# Patient Record
Sex: Female | Born: 1975 | Race: Black or African American | Hispanic: No | Marital: Married | Smoking: Never smoker
Health system: Southern US, Community
[De-identification: ages and names within clinical notes are randomized; demographics above are authoritative.]

## PROBLEM LIST (undated history)

## (undated) DIAGNOSIS — I10 Essential (primary) hypertension: Secondary | ICD-10-CM

---

## 2014-05-13 ENCOUNTER — Ambulatory Visit: Payer: Self-pay

## 2015-02-19 ENCOUNTER — Encounter (HOSPITAL_COMMUNITY): Payer: Self-pay | Admitting: Emergency Medicine

## 2015-02-19 ENCOUNTER — Emergency Department (HOSPITAL_COMMUNITY)
Admission: EM | Admit: 2015-02-19 | Discharge: 2015-02-19 | Disposition: A | Discharge status: Home / Self Care | Payer: Self-pay | Attending: Emergency Medicine | Admitting: Emergency Medicine

## 2015-02-19 DIAGNOSIS — X58XXXA Exposure to other specified factors, initial encounter: Secondary | ICD-10-CM | POA: Insufficient documentation

## 2015-02-19 DIAGNOSIS — Y929 Unspecified place or not applicable: Secondary | ICD-10-CM | POA: Insufficient documentation

## 2015-02-19 DIAGNOSIS — Y939 Activity, unspecified: Secondary | ICD-10-CM | POA: Insufficient documentation

## 2015-02-19 DIAGNOSIS — T63441A Toxic effect of venom of bees, accidental (unintentional), initial encounter: Secondary | ICD-10-CM | POA: Insufficient documentation

## 2015-02-19 DIAGNOSIS — Y999 Unspecified external cause status: Secondary | ICD-10-CM | POA: Insufficient documentation

## 2015-02-19 MED ORDER — FAMOTIDINE IN NACL 20-0.9 MG/50ML-% IV SOLN
20.0000 mg | Freq: Once | INTRAVENOUS | Status: AC
  Administered 2015-02-19: 20 mg via INTRAVENOUS
  Filled 2015-02-19: qty 50

## 2015-02-19 MED ORDER — SODIUM CHLORIDE 0.9 % IV BOLUS (SEPSIS)
1000.0000 mL | Freq: Once | INTRAVENOUS | Status: AC
  Administered 2015-02-19: 1000 mL via INTRAVENOUS

## 2015-02-19 MED ORDER — DEXAMETHASONE SODIUM PHOSPHATE 10 MG/ML IJ SOLN
10.0000 mg | Freq: Once | INTRAMUSCULAR | Status: AC
  Administered 2015-02-19: 10 mg via INTRAVENOUS
  Filled 2015-02-19: qty 1

## 2015-02-19 NOTE — ED Provider Notes (Signed)
CSN: [id number]     Arrival date & time 02/19/15  1925 History   First MD Initiated Contact with Patient 02/19/15 1942     Chief Complaint  Patient presents with  . Insect Bite     (Consider location/radiation/quality/duration/timing/severity/associated sxs/prior Treatment) HPI Patient presents to the emergency department with a bee sting to her right axillary region.  The patient states that she was stung by a bee this afternoon in the area swollen and red.  The patient states that there is some pain noted to the area.  Patient denies shortness of breath, chest pain, nausea, vomiting, weakness, dizziness, headache, blurred vision, or syncope.  The patient states that she does not have any chest tightness or wheezing.  The patient denies that anything makes her condition better or worse.  She did not take any medications prior to arrival  History reviewed. No pertinent past medical history. History reviewed. No pertinent past surgical history. No family history on file. History  Substance Use Topics  . Smoking status: Never Smoker   . Smokeless tobacco: Not on file  . Alcohol Use: No   OB History    No data available     Review of Systems  All other systems negative except as documented in the HPI. All pertinent positives and negatives as reviewed in the HPI.  Allergies  Review of patient's allergies indicates no known allergies.  Home Medications   Prior to Admission medications   Not on File   BP 124/81 mmHg  Pulse 68  Temp(Src) 99.6 F (37.6 C) (Oral)  Resp 16  Ht 5\' 8"  (1.727 m)  Wt 187 lb (84.823 kg)  BMI 28.44 kg/m2  SpO2 99%  LMP 01/27/2015 Physical Exam  Constitutional: She is oriented to person, place, and time. She appears well-developed and well-nourished. No distress.  HENT:  Head: Normocephalic and atraumatic.  Mouth/Throat: Oropharynx is clear and moist. No oropharyngeal exudate.  Eyes: Pupils are equal, round, and reactive to light.  Neck: Normal  range of motion. Neck supple.  Cardiovascular: Normal rate, regular rhythm and normal heart sounds.  Exam reveals no gallop and no friction rub.   No murmur heard. Pulmonary/Chest: Effort normal and breath sounds normal. No respiratory distress.  Neurological: She is alert and oriented to person, place, and time.  Skin: Skin is warm and dry. No rash noted. No erythema.     Nursing note and vitals reviewed.   ED Course  Procedures (including critical care time)   Patient was given IV fluids along with Decadron and Pepcid.  She will be told to return here as needed.  Advised the patient take Benadryl use ice on the area  Charlestine Night, PA-C 02/19/15 2208  Bethann Berkshire, MD 02/21/15 1530

## 2015-02-19 NOTE — Discharge Instructions (Signed)
Take Benadryl and over-the-counter Pepcid.  Use ice on the areas well.  Return here as needed

## 2015-02-19 NOTE — ED Notes (Signed)
Pt. Stung by a bee this afternoon at right axilla , presents with redness/swelling at right axilla , airway intact/ respirations unlabored .

## 2015-02-19 NOTE — ED Notes (Signed)
Pt was stung by a bee to right axilla. 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 Reports she just wanted to be safe in case she had a reaction.

## 2016-03-21 ENCOUNTER — Emergency Department (HOSPITAL_COMMUNITY): Payer: Self-pay

## 2016-03-21 ENCOUNTER — Encounter (HOSPITAL_COMMUNITY): Payer: Self-pay | Admitting: Emergency Medicine

## 2016-03-21 ENCOUNTER — Emergency Department (HOSPITAL_COMMUNITY)
Admission: EM | Admit: 2016-03-21 | Discharge: 2016-03-21 | Disposition: A | Discharge status: Home / Self Care | Payer: Self-pay | Attending: Emergency Medicine | Admitting: Emergency Medicine

## 2016-03-21 DIAGNOSIS — Y9367 Activity, basketball: Secondary | ICD-10-CM | POA: Insufficient documentation

## 2016-03-21 DIAGNOSIS — Y999 Unspecified external cause status: Secondary | ICD-10-CM | POA: Insufficient documentation

## 2016-03-21 DIAGNOSIS — S93402A Sprain of unspecified ligament of left ankle, initial encounter: Secondary | ICD-10-CM | POA: Insufficient documentation

## 2016-03-21 DIAGNOSIS — X509XXA Other and unspecified overexertion or strenuous movements or postures, initial encounter: Secondary | ICD-10-CM | POA: Insufficient documentation

## 2016-03-21 DIAGNOSIS — Y92310 Basketball court as the place of occurrence of the external cause: Secondary | ICD-10-CM | POA: Insufficient documentation

## 2016-03-21 MED ORDER — OXYCODONE-ACETAMINOPHEN 5-325 MG PO TABS
1.0000 | ORAL_TABLET | Freq: Once | ORAL | Status: AC
  Administered 2016-03-21: 1 via ORAL
  Filled 2016-03-21: qty 1

## 2016-03-21 MED ORDER — CYCLOBENZAPRINE HCL 10 MG PO TABS: 10.0000 mg | ORAL_TABLET | Freq: Two times a day (BID) | ORAL | 0 refills | Status: DC | PRN

## 2016-03-21 MED ORDER — IBUPROFEN 800 MG PO TABS: 800.0000 mg | ORAL_TABLET | Freq: Three times a day (TID) | ORAL | 0 refills | Status: DC

## 2016-03-21 NOTE — Progress Notes (Signed)
Orthopedic Tech Progress Note Patient Details:  Jessica Valdez 08-21-1976 [id number]  Ortho Devices Type of Ortho Device: ASO, Crutches Ortho Device/Splint Location: LLE Ortho Device/Splint Interventions: Ordered, Application, Adjustment   Jennye Moccasin 03/21/2016, 10:23 PM

## 2016-03-21 NOTE — ED Provider Notes (Signed)
[facility]EM[facility] DEPT Provider Note   CSN: [id number] Arrival date & time: 03/21/16  2052  First Provider Contact:  None    By signing my name below, I, [facility], attest that this documentation has been prepared under the direction and in the presence of Fayrene Helper, PA-C. Electronically Signed: Randell Patient, ED Scribe. 03/21/16. 10:11 PM.    History   Chief Complaint Chief Complaint  Patient presents with  . Ankle Pain    HPI Jessica Valdez is a 40 y.o. female who presents to the Emergency Department complaining of constant, severe, left ankle pain onset after a sports injury that occurred 2 hours ago. Pt states that she was playing basketball when she jumped up, landed, twisted her left ankle followed immediately by pain after which she fell to the ground. She reports associated swelling to the left ankle. Pain is worse with weight bearing and palpation. She has taken 3x ibuprofen 2 hours ago without relief. Denies any other injuries or numbness.  The history is provided by the patient. No language interpreter was used.    History reviewed. No pertinent past medical history.  There are no active problems to display for this patient.   History reviewed. No pertinent surgical history.  OB History    No data available       Home Medications    Prior to Admission medications   Not on File    Family History No family history on file.  Social History Social History  Substance Use Topics  . Smoking status: Never Smoker  . Smokeless tobacco: Not on file  . Alcohol use No     Allergies   Review of patient's allergies indicates no known allergies.   Review of Systems Review of Systems  Musculoskeletal: Positive for arthralgias (left ankle) and joint swelling.  Neurological: Negative for numbness.     Physical Exam Updated Vital Signs BP (!) 155/103 (BP Location: Left Arm)   Pulse 77   Temp 98.2 F (36.8 C) (Oral)   Resp 18   Ht   (1.727 m)   Wt 175 lb (79.4 kg)   LMP 03/08/2016   SpO2 100%   BMI 26.61 kg/m   Physical Exam  Constitutional: She is oriented to person, place, and time. She appears well-developed and well-nourished. No distress.  HENT:  Head: Normocephalic and atraumatic.  Eyes: Conjunctivae are normal.  Neck: Normal range of motion.  Cardiovascular: Normal rate.   Pulmonary/Chest: Effort normal. No respiratory distress.  Musculoskeletal: Normal range of motion. She exhibits edema and tenderness.  Significant tenderness to left lateral malleolus without skin tenting. Tenderness to medial malleolus and fifth metatarsal. Decreased ROM secondary to pain. Mild tenderness to left tib-fib region without crepitus. Left knee is normal.  Neurological: She is alert and oriented to person, place, and time.  Skin: Skin is warm and dry.  Psychiatric: She has a normal mood and affect. Her behavior is normal.  Nursing note and vitals reviewed.    ED Treatments / Results   DIAGNOSTIC STUDIES: Oxygen Saturation is 100% on RA, normal by my interpretation.    COORDINATION OF CARE: 9:06 PM Will order Percocet and x-rays of left ankle, left foot, and left tibia/fibula. Discussed treatment plan with pt at bedside and pt agreed to plan.  10:10 PM Reviewed results of left foot, ankle, and lower leg imaging. Returned to discuss results with pt. Will discharge pt with ankle ASO wrap and crutches.  Radiology No results found.  Procedures  Procedures   Medications Ordered in ED Medications  oxyCODONE-acetaminophen (PERCOCET/ROXICET) 5-325 MG per tablet 1 tablet (1 tablet Oral Given 03/21/16 2113)     Initial Impression / Assessment and Plan / ED Course  I have reviewed the triage vital signs and the nursing notes.  Pertinent labs & imaging results that were available during my care of the patient were reviewed by me and considered in my medical decision making (see chart for details).  Clinical Course     BP (!) 155/103 (BP Location: Left Arm)   Pulse 77   Temp 98.2 F (36.8 C) (Oral)   Resp 18   Ht 5\' 8"  (1.727 m)   Wt 79.4 kg   LMP 03/08/2016   SpO2 100%   BMI 26.61 kg/m    Final Clinical Impressions(s) / ED Diagnoses   Final diagnoses:  Left ankle sprain, initial encounter    New Prescriptions New Prescriptions   No medications on file   I personally performed the services described in this documentation, which was scribed in my presence. The recorded information has been reviewed and is accurate.       Fayrene Helper, PA-C 03/21/16 2231    Leta Baptist, MD 03/22/16 [phone]

## 2016-03-21 NOTE — ED Triage Notes (Signed)
Patient presents with left ankle swelling / pain onset today injured while playing basketball .

## 2016-04-08 ENCOUNTER — Encounter (HOSPITAL_COMMUNITY): Payer: Self-pay | Admitting: Emergency Medicine

## 2016-04-08 ENCOUNTER — Emergency Department (HOSPITAL_COMMUNITY): Payer: Self-pay

## 2016-04-08 DIAGNOSIS — R079 Chest pain, unspecified: Secondary | ICD-10-CM | POA: Insufficient documentation

## 2016-04-08 DIAGNOSIS — R51 Headache: Secondary | ICD-10-CM | POA: Insufficient documentation

## 2016-04-08 LAB — CBC WITH DIFFERENTIAL/PLATELET
Basophils Absolute: 0.1 10*3/uL (ref 0.0–0.1)
Basophils Relative: 1 %
EOS PCT: 3 %
Eosinophils Absolute: 0.2 10*3/uL (ref 0.0–0.7)
HCT: 37.7 % (ref 36.0–46.0)
Hemoglobin: 12.5 g/dL (ref 12.0–15.0)
LYMPHS ABS: 3 10*3/uL (ref 0.7–4.0)
LYMPHS PCT: 47 %
MCH: 22.9 pg — ABNORMAL LOW (ref 26.0–34.0)
MCHC: 33.2 g/dL (ref 30.0–36.0)
MCV: 69.2 fL — AB (ref 78.0–100.0)
Monocytes Absolute: 0.3 10*3/uL (ref 0.1–1.0)
Monocytes Relative: 5 %
NEUTROS ABS: 2.9 10*3/uL (ref 1.7–7.7)
NEUTROS PCT: 44 %
Platelets: 355 10*3/uL (ref 150–400)
RBC: 5.45 MIL/uL — AB (ref 3.87–5.11)
RDW: 15 % (ref 11.5–15.5)
WBC: 6.5 10*3/uL (ref 4.0–10.5)

## 2016-04-08 LAB — COMPREHENSIVE METABOLIC PANEL
ALBUMIN: 4.1 g/dL (ref 3.5–5.0)
ALK PHOS: 64 U/L (ref 38–126)
ALT: 11 U/L — ABNORMAL LOW (ref 14–54)
AST: 24 U/L (ref 15–41)
Anion gap: 9 (ref 5–15)
BILIRUBIN TOTAL: 0.6 mg/dL (ref 0.3–1.2)
BUN: 10 mg/dL (ref 6–20)
CALCIUM: 9.5 mg/dL (ref 8.9–10.3)
CO2: 26 mmol/L (ref 22–32)
Chloride: 100 mmol/L — ABNORMAL LOW (ref 101–111)
Creatinine, Ser: 1.02 mg/dL — ABNORMAL HIGH (ref 0.44–1.00)
GFR calc Af Amer: >60 mL/min (ref >60)
GLUCOSE: 118 mg/dL — AB (ref 65–99)
POTASSIUM: 3.8 mmol/L (ref 3.5–5.1)
Sodium: 135 mmol/L (ref 135–145)
Total Protein: 8 g/dL (ref 6.5–8.1)

## 2016-04-08 LAB — I-STAT TROPONIN, ED: TROPONIN I, POC: 0 ng/mL (ref 0.00–0.08)

## 2016-04-08 LAB — URINALYSIS, ROUTINE W REFLEX MICROSCOPIC
BILIRUBIN URINE: NEGATIVE
GLUCOSE, UA: NEGATIVE mg/dL
HGB URINE DIPSTICK: NEGATIVE
KETONES UR: NEGATIVE mg/dL
Leukocytes, UA: NEGATIVE
Nitrite: NEGATIVE
PH: 7.5 (ref 5.0–8.0)
Protein, ur: NEGATIVE mg/dL
SPECIFIC GRAVITY, URINE: 1.019 (ref 1.005–1.030)

## 2016-04-08 LAB — POC URINE PREG, ED: Preg Test, Ur: NEGATIVE

## 2016-04-08 NOTE — ED Triage Notes (Signed)
Pt c/o mid chest pain onset approx 3pm today.  Pt st's pain increases with deep breathing and movement

## 2016-04-09 ENCOUNTER — Emergency Department (HOSPITAL_COMMUNITY)
Admission: EM | Admit: 2016-04-09 | Discharge: 2016-04-09 | Disposition: A | Discharge status: Home / Self Care | Payer: Self-pay | Attending: Emergency Medicine | Admitting: Emergency Medicine

## 2016-04-09 ENCOUNTER — Emergency Department (HOSPITAL_COMMUNITY): Payer: Self-pay

## 2016-04-09 DIAGNOSIS — R079 Chest pain, unspecified: Secondary | ICD-10-CM

## 2016-04-09 DIAGNOSIS — R51 Headache: Secondary | ICD-10-CM

## 2016-04-09 DIAGNOSIS — R519 Headache, unspecified: Secondary | ICD-10-CM

## 2016-04-09 LAB — D-DIMER, QUANTITATIVE: D-Dimer, Quant: 1.88 ug/mL-FEU — ABNORMAL HIGH (ref 0.00–0.50)

## 2016-04-09 LAB — I-STAT TROPONIN, ED: TROPONIN I, POC: 0 ng/mL (ref 0.00–0.08)

## 2016-04-09 MED ORDER — SODIUM CHLORIDE 0.9 % IV BOLUS (SEPSIS)
500.0000 mL | Freq: Once | INTRAVENOUS | Status: AC
  Administered 2016-04-09: 500 mL via INTRAVENOUS

## 2016-04-09 MED ORDER — FENTANYL CITRATE (PF) 100 MCG/2ML IJ SOLN
50.0000 ug | Freq: Once | INTRAMUSCULAR | Status: AC
  Administered 2016-04-09: 50 ug via INTRAVENOUS
  Filled 2016-04-09: qty 2

## 2016-04-09 MED ORDER — IOPAMIDOL (ISOVUE-370) INJECTION 76%
INTRAVENOUS | Status: AC
  Administered 2016-04-09: 100 mL
  Filled 2016-04-09: qty 100

## 2016-04-09 NOTE — ED Notes (Signed)
Patient transported to CT 

## 2016-04-09 NOTE — ED Notes (Signed)
RN called CT for status on when pt will be going over for study; Jessica JacobsonHelen states she is next

## 2016-04-09 NOTE — ED Notes (Signed)
Patient returned from CT

## 2016-04-09 NOTE — Discharge Instructions (Signed)
Read the information below.   Imaging of your chest was negative for a blood clot. The rest of your labs were re-assuring.  You ca take tylenol 650mg  every 6hrs or motrin 400mg  every 6hrs for pain relief  It is important to follow up with your primary care doctor for re-evaluation. If you do not have a primary care doctor I [facility]. Be sure to follow up within one week.  You may return to the Emergency Department at any time for worsening condition or any new symptoms that concern you. Return to ED if your symptoms worsen or you develop fever, coughing up blood, one sided leg swelling/pain, numbness, weakness, facial droop, or slurred speech.

## 2016-04-09 NOTE — ED Provider Notes (Signed)
[facility] Provider Note   CSN: [id number] Arrival date & time: 04/08/16  1947  First Provider Contact:  None       History   Chief Complaint Chief Complaint  Patient presents with  . Chest Pain    HPI Jessica Valdez is a 40 y.o. female.  Jessica Valdez is a 40 y.o. female presents to ED with complaint of chest pain. Patient states chest pain started at 3pm Thursday. It is constant 10/10, centrally located, non-radiating, non-exertional, described as a pressure sensation. It is worse with certain movements and with deep inspiration. Denies SOB or cough. No recent long distance travel/surgery/immobilization, h/o blood clots, h/o cancer, hemoptysis, or hormone use. No lower extremity swelling or pain. No dizziness or lightheadedness. She denies any chronic medical conditions. No history of cardiac conditions. No family h/o cardiac conditions.   Of note, patient also mentions onset of headache around time of chest pain. Headache was gradual in onset, throbbing in nature, on left side of head. She has associated photophobia. No N/V/D/C. Denies fever or neck pain. No facial droop or slurred speech. No trauma. No weakness or numbness. No LOC.   She has tried aspirin and BC powder with minimal relief of symptoms.        History reviewed. No pertinent past medical history.  There are no active problems to display for this patient.   History reviewed. No pertinent surgical history.  OB History    No data available       Home Medications    Prior to Admission medications   Medication Sig Start Date End Date Taking? Authorizing Provider  cyclobenzaprine (FLEXERIL) 10 MG tablet Take 1 tablet (10 mg total) by mouth 2 (two) times daily as needed for muscle spasms. Patient not taking: Reported on 04/09/2016 03/21/16   Fayrene Helper, PA-C  ibuprofen (ADVIL,MOTRIN) 800 MG tablet Take 1 tablet (800 mg total) by mouth 3 (three) times daily. Patient not taking: Reported on  04/09/2016 03/21/16   Fayrene Helper, PA-C    Family History No family history on file.  Social History Social History  Substance Use Topics  . Smoking status: Never Smoker  . Smokeless tobacco: Never Used  . Alcohol use No     Allergies   Review of patient's allergies indicates no known allergies.   Review of Systems Review of Systems  Constitutional: Negative for fever.  HENT: Negative for congestion, sore throat and trouble swallowing.   Eyes: Negative for visual disturbance.  Respiratory: Positive for shortness of breath. Negative for cough.   Cardiovascular: Positive for chest pain.  Gastrointestinal: Negative for abdominal pain, constipation, diarrhea, nausea and vomiting.  Genitourinary: Negative for dysuria and hematuria.  Musculoskeletal: Negative for neck pain.  Skin: Negative for rash.  Neurological: Positive for headaches. Negative for dizziness, seizures, syncope, facial asymmetry, speech difficulty, weakness, light-headedness and numbness.  All other systems reviewed and are negative.    Physical Exam Updated Vital Signs BP 129/80 (BP Location: Left Arm)   Pulse (!) 57   Temp 98 F (36.7 C) (Oral)   Resp 14   LMP 04/02/2016   SpO2 100%   Physical Exam  Constitutional: She appears well-developed and well-nourished. No distress.  HENT:  Head: Normocephalic and atraumatic.  Mouth/Throat: Oropharynx is clear and moist. No oropharyngeal exudate.  Eyes: Conjunctivae and EOM are normal. Pupils are equal, round, and reactive to light. Right eye exhibits no discharge. Left eye exhibits no discharge. No scleral icterus.  Neck: Normal  range of motion and phonation normal. Neck supple. No neck rigidity. Normal range of motion present.  Cardiovascular: Normal rate, regular rhythm, normal heart sounds and intact distal pulses.   No murmur heard. Pulmonary/Chest: Effort normal and breath sounds normal. No stridor. No respiratory distress. She exhibits tenderness.     Abdominal: Soft. Bowel sounds are normal. There is no tenderness. There is no rebound and no guarding.  Musculoskeletal: Normal range of motion.  No lower extremity swelling. No TTP of posterior calf. No palpable cords.    Lymphadenopathy:    She has no cervical adenopathy.  Neurological: She is alert. She is not disoriented. Coordination normal. GCS eye subscore is 4. GCS verbal subscore is 5. GCS motor subscore is 6.  Mental Status:  Alert, thought content appropriate, able to give a coherent history. Speech fluent without evidence of aphasia. Able to follow 2 step commands without difficulty.  Cranial Nerves:  II:  Peripheral visual fields grossly normal, pupils equal, round, reactive to light III,IV, VI: ptosis not present, extra-ocular motions intact bilaterally  V,VII: smile symmetric, facial light touch sensation equal VIII: hearing grossly normal to voice  X: uvula elevates symmetrically  XI: bilateral shoulder shrug symmetric and strong XII: midline tongue extension without fassiculations Motor:  Normal tone. 5/5 in upper and lower extremities bilaterally including strong and equal grip strength and dorsiflexion/plantar flexion Sensory: light touch normal in all extremities. Cerebellar: normal finger-to-nose with bilateral upper extremities Gait: normal gait and balance CV: distal pulses palpable throughout    Skin: Skin is warm and dry. She is not diaphoretic.  Psychiatric: She has a normal mood and affect. Her behavior is normal.     ED Treatments / Results  Labs (all labs ordered are listed, but only abnormal results are displayed) Labs Reviewed  CBC WITH DIFFERENTIAL/PLATELET - Abnormal; Notable for the following:       Result Value   RBC 5.45 (*)    MCV 69.2 (*)    MCH 22.9 (*)    All other components within normal limits  COMPREHENSIVE METABOLIC PANEL - Abnormal; Notable for the following:    Chloride 100 (*)    Glucose, Bld 118 (*)    Creatinine, Ser  1.02 (*)    ALT 11 (*)    All other components within normal limits  D-DIMER, QUANTITATIVE (NOT AT [facility]) - Abnormal; Notable for the following:    D-Dimer, Quant 1.88 (*)    All other components within normal limits  URINALYSIS, ROUTINE W REFLEX MICROSCOPIC (NOT AT [facility])  I-STAT TROPOININ, ED  POC URINE PREG, ED  Rosezena SensorI-STAT TROPOININ, ED  Rosezena SensorI-STAT TROPOININ, ED    EKG  EKG Interpretation  Date/Time:  Thursday April 08 2016 19:53:20 EDT Ventricular Rate:  74 PR Interval:  148 QRS Duration: 80 QT Interval:  376 QTC Calculation: 417 R Axis:   36 Text Interpretation:  Normal sinus rhythm Biatrial enlargement Nonspecific T wave abnormality Abnormal ECG No previous ECGs available Confirmed by [facility] MD, ERIN ([id number]) on 04/09/2016 5:29:48 AM Also confirmed by [facility], ERIN ([id number]), editor [place] CT, Jola BabinskiMarilyn [phone]([id number])  on 04/09/2016 7:31:16 AM       Radiology Dg Chest 2 View  Result Date: 04/08/2016 CLINICAL DATA:  40 year old female with chest pain. EXAM: CHEST  2 VIEW COMPARISON:  None. FINDINGS: The cardiomediastinal silhouette is unremarkable. There is no evidence of focal airspace disease, pulmonary edema, suspicious pulmonary nodule/mass, pleural effusion, or pneumothorax. No acute bony abnormalities are identified. IMPRESSION: No active cardiopulmonary  disease. Electronically Signed   By: Harmon Pier M.D.   On: 04/08/2016 20:30   Ct Angio Chest Pe W/cm &/or Wo Cm  Result Date: 04/09/2016 CLINICAL DATA:  Acute onset of generalized chest pain. Initial encounter. EXAM: CT ANGIOGRAPHY CHEST WITH CONTRAST TECHNIQUE: Multidetector CT imaging of the chest was performed using the standard protocol during bolus administration of intravenous contrast. Multiplanar CT image reconstructions and MIPs were obtained to evaluate the vascular anatomy. CONTRAST:  100 mL of Isovue 370 IV contrast COMPARISON:  Chest radiograph performed 04/08/2016 FINDINGS: There is no evidence of pulmonary embolus.  Minimal left basilar atelectasis is noted. There is no evidence of significant focal consolidation, pleural effusion or pneumothorax. No masses are identified; no abnormal focal contrast enhancement is seen. The mediastinum is unremarkable in appearance. No mediastinal lymphadenopathy is seen. No pericardial effusion is identified. The great vessels are grossly unremarkable. No axillary lymphadenopathy is seen. The thyroid gland is unremarkable in appearance. The visualized portions of the liver and spleen are unremarkable. The visualized portions of the pancreas, gallbladder, stomach, adrenal glands and kidneys are within normal limits. No acute osseous abnormalities are seen. Review of the MIP images confirms the above findings. IMPRESSION: 1. No evidence of pulmonary embolus. 2. Minimal left basilar atelectasis noted.  Lungs otherwise clear. Electronically Signed   By: Roanna Raider M.D.   On: 04/09/2016 04:50    Procedures Procedures (including critical care time)  Medications Ordered in ED Medications  sodium chloride 0.9 % bolus 500 mL (0 mLs Intravenous Stopped 04/09/16 0337)  fentaNYL (SUBLIMAZE) injection 50 mcg (50 mcg Intravenous Given 04/09/16 0230)  iopamidol (ISOVUE-370) 76 % injection (100 mLs  Contrast Given 04/09/16 0414)     Initial Impression / Assessment and Plan / ED Course  I have reviewed the triage vital signs and the nursing notes.  Pertinent labs & imaging results that were available during my care of the patient were reviewed by me and considered in my medical decision making (see chart for details).  Clinical Course  Value Comment By Time  DG Chest 2 View reviewed Lona Kettle, [place] 08/10 2100  CT Angio Chest PE W/Cm &/Or Wo Cm reviewed Lona Kettle, PA-C 08/11 4782   Patient is afebrile and non-toxic appearing in NAD. Vital signs are stable. Physical exam remarkable for reproducible tenderness of left anterior chest wall. Initial troponin negative. CXR  shows no acute abnormality - low suspicion for PNA, pleural effusion, or PTX. EKG shows NSR with non-specific T-wave changes. CBC unremarkable. U/A unremarkable. CMP remarkable for mild elevation of creatinine at 1.02, otherwise re-assuring. D-dimer positive. CTA negative for pulmonary embolus. Pain medication and fluids given in ED with improvement in sxs. Heart score is one - low suspicion for cardiac etiology. Suspect pain may be ?MSK - reproducible on exam.   Regarding headache - Pt HA treated and improved while in ED.  Presentation non concerning for [facility], ICH, Meningitis, or temporal arteritis. Pt is afebrile with no focal neuro deficits, nuchal rigidity, or change in vision. Pt is to follow up with PCP to discuss further management of HAs. Pt verbalizes understanding and is agreeable with plan.   Discussed results with patient. Discussed symptomatic management. Encouraged follow up with PCP and provided contact information for [facility] for further evaluation. Return precautions provided. Patient voiced understanding and is agreeable.   Final Clinical Impressions(s) / ED Diagnoses   Final diagnoses:  Chest pain, unspecified chest pain type  Nonintractable headache, unspecified chronicity pattern, unspecified headache type    New Prescriptions Discharge Medication List as of 04/09/2016  6:15 AM       Lona Kettle, PA-C 04/09/16 8119    Alvira Monday, MD 04/12/16 1259

## 2016-04-09 NOTE — ED Notes (Signed)
RN called main lab to add on DDimer; lab verbalized understanding

## 2016-11-13 ENCOUNTER — Encounter (HOSPITAL_COMMUNITY): Payer: Self-pay

## 2016-11-13 ENCOUNTER — Emergency Department (HOSPITAL_COMMUNITY)
Admission: EM | Admit: 2016-11-13 | Discharge: 2016-11-13 | Disposition: A | Discharge status: Home / Self Care | Payer: Self-pay | Attending: Emergency Medicine | Admitting: Emergency Medicine

## 2016-11-13 DIAGNOSIS — I10 Essential (primary) hypertension: Secondary | ICD-10-CM | POA: Insufficient documentation

## 2016-11-13 DIAGNOSIS — G44219 Episodic tension-type headache, not intractable: Secondary | ICD-10-CM | POA: Insufficient documentation

## 2016-11-13 DIAGNOSIS — Z79899 Other long term (current) drug therapy: Secondary | ICD-10-CM | POA: Insufficient documentation

## 2016-11-13 LAB — BASIC METABOLIC PANEL
ANION GAP: 10 (ref 5–15)
BUN: 10 mg/dL (ref 6–20)
CO2: 25 mmol/L (ref 22–32)
Calcium: 9.5 mg/dL (ref 8.9–10.3)
Chloride: 101 mmol/L (ref 101–111)
Creatinine, Ser: 0.93 mg/dL (ref 0.44–1.00)
Glucose, Bld: 94 mg/dL (ref 65–99)
POTASSIUM: 3.7 mmol/L (ref 3.5–5.1)
SODIUM: 136 mmol/L (ref 135–145)

## 2016-11-13 LAB — CBC
HCT: 39.3 % (ref 36.0–46.0)
HEMOGLOBIN: 12.9 g/dL (ref 12.0–15.0)
MCH: 22.8 pg — ABNORMAL LOW (ref 26.0–34.0)
MCHC: 32.8 g/dL (ref 30.0–36.0)
MCV: 69.6 fL — ABNORMAL LOW (ref 78.0–100.0)
PLATELETS: 299 10*3/uL (ref 150–400)
RBC: 5.65 MIL/uL — AB (ref 3.87–5.11)
RDW: 14.6 % (ref 11.5–15.5)
WBC: 5 10*3/uL (ref 4.0–10.5)

## 2016-11-13 MED ORDER — HYDROCHLOROTHIAZIDE 25 MG PO TABS
12.5000 mg | ORAL_TABLET | Freq: Every day | ORAL | 0 refills | Status: AC
Start: 2016-11-13 — End: ?

## 2016-11-13 MED ORDER — ACETAMINOPHEN 325 MG PO TABS
650.0000 mg | ORAL_TABLET | Freq: Once | ORAL | Status: AC
  Administered 2016-11-13: 650 mg via ORAL
  Filled 2016-11-13: qty 2

## 2016-11-13 NOTE — Discharge Instructions (Signed)
Please begin medication and follow up with a primary care doctor in the next week.  Return if worse at any time especially weakness or difficulty speaking.

## 2016-11-13 NOTE — ED Triage Notes (Signed)
Pt reports she had been having an extreme headache with blurry vision that started yesterday, which prompted her to take her BP. Shes kept a log of her BPs since yesterday and it was 164/116 and this morning it was 159/106. No hx of HTN.

## 2016-11-13 NOTE — ED Provider Notes (Signed)
[facility]-EMERGENCY DEPT Provider Note   CSN: [id number] Arrival date & time: 11/13/16  [id number]     History   Chief Complaint Chief Complaint  Patient presents with  . Hypertension     HPI Delray Altabitha L Mclees is a 41 y.o. female.  HPI 41 y.o. Female presents complaiing of hypertension.  She has no prior history.  She has chronic headaches.  Yesterday she began having bitemporal headache and felt light headed.  Contact suggested checking bp and she noted bp elevaetd at 164.  She has continued to note it is elevated.  She denies chest pain, dyspnea.   History reviewed. No pertinent past medical history.  There are no active problems to display for this patient.   History reviewed. No pertinent surgical history.  OB History    No data available       Home Medications    Prior to Admission medications   Medication Sig Start Date End Date Taking? Authorizing Provider  cyclobenzaprine (FLEXERIL) 10 MG tablet Take 1 tablet (10 mg total) by mouth 2 (two) times daily as needed for muscle spasms. Patient not taking: Reported on 04/09/2016 03/21/16   Fayrene HelperBowie Tran, PA-C  ibuprofen (ADVIL,MOTRIN) 800 MG tablet Take 1 tablet (800 mg total) by mouth 3 (three) times daily. Patient not taking: Reported on 04/09/2016 03/21/16   Fayrene HelperBowie Tran, PA-C    Family History No family history on file.  Social History Social History  Substance Use Topics  . Smoking status: Never Smoker  . Smokeless tobacco: Never Used  . Alcohol use No     Allergies   Patient has no known allergies.   Review of Systems Review of Systems  All other systems reviewed and are negative.    Physical Exam Updated Vital Signs BP (!) 154/110 (BP Location: Left Arm)   Pulse 72   Temp 98.5 F (36.9 C) (Oral)   Resp 16   Ht 5\' 8"  (1.727 m)   Wt 79.4 kg   LMP 11/10/2016   SpO2 100%   BMI 26.61 kg/m   Physical Exam  Constitutional: She is oriented to person, place, and time. She appears well-developed and  well-nourished.  HENT:  Head: Normocephalic and atraumatic.  Right Ear: External ear normal.  Left Ear: External ear normal.  Nose: Nose normal.  Mouth/Throat: Oropharynx is clear and moist.  Eyes: Conjunctivae and EOM are normal. Pupils are equal, round, and reactive to light.  Neck: Normal range of motion. Neck supple.  Cardiovascular: Normal rate, regular rhythm, normal heart sounds and intact distal pulses.   Pulmonary/Chest: Effort normal and breath sounds normal.  Abdominal: Soft. Bowel sounds are normal.  Musculoskeletal: Normal range of motion.  Neurological: She is alert and oriented to person, place, and time. She displays normal reflexes. No cranial nerve deficit. She exhibits normal muscle tone. Coordination normal.  Skin: Skin is warm and dry. Capillary refill takes less than 2 seconds.  Psychiatric: She has a normal mood and affect.  Nursing note and vitals reviewed.    ED Treatments / Results  Labs (all labs ordered are listed, but only abnormal results are displayed) Labs Reviewed  CBC  BASIC METABOLIC PANEL    EKG  EKG Interpretation  Date/Time:  Saturday November 13 2016 10:34:52 EDT Ventricular Rate:  62 PR Interval:    QRS Duration: 102 QT Interval:  442 QTC Calculation: 449 R Axis:   46 Text Interpretation:  Sinus rhythm Nonspecific T abnormalities, anterior leads No significant change since last tracing  Confirmed by Anquinette Pierro MD, Duwayne Heck [phone]) on 11/13/2016 10:51:35 AM       Radiology No results found.  Procedures Procedures (including critical care time)  Medications Ordered in ED Medications  acetaminophen (TYLENOL) tablet 650 mg (not administered)     Initial Impression / Assessment and Plan / ED Course  I have reviewed the triage vital signs and the nursing notes.  Pertinent labs & imaging results that were available during my care of the patient were reviewed by me and considered in my medical decision making (see chart for details).       Ms. Gailey is a previously healthy 41 year old female comes in today complaining of headache and high blood pressure. Headache appears most consistent with tension-type headache. She had no acute onset of headache and has been having similar headaches for an extended period of time. I have low index of suspicion that she has bleeding or other acute intracranial abnormality. She has been following her blood pressure since yesterday and has noted to be high. Her systolic blood pressure here was 164. She has had consistently elevated blood pressures while in the department. However the most recent is 137/89. Plan to start hydrochlorothiazide 12.5 mg per day and have close follow-up. Patient is advised regarding return precautions need for follow-up and voice understanding.  Final Clinical Impressions(s) / ED Diagnoses   Final diagnoses:  Essential hypertension  Episodic tension-type headache, not intractable    New Prescriptions Discharge Medication List as of 11/13/2016 12:57 PM    START taking these medications   Details  hydrochlorothiazide (HYDRODIURIL) 25 MG tablet Take 0.5 tablets (12.5 mg total) by mouth daily., Starting Sat 11/13/2016, Print         Margarita Grizzle, MD 11/14/16 [phone]

## 2016-11-13 NOTE — ED Notes (Signed)
Report given to Herminia Warren H RN

## 2018-02-15 ENCOUNTER — Other Ambulatory Visit: Payer: Self-pay

## 2018-02-15 ENCOUNTER — Emergency Department (HOSPITAL_COMMUNITY): Payer: Self-pay

## 2018-02-15 ENCOUNTER — Emergency Department (HOSPITAL_COMMUNITY)
Admission: EM | Admit: 2018-02-15 | Discharge: 2018-02-15 | Disposition: A | Discharge status: Home / Self Care | Payer: Self-pay | Attending: Emergency Medicine | Admitting: Emergency Medicine

## 2018-02-15 ENCOUNTER — Encounter (HOSPITAL_COMMUNITY): Payer: Self-pay

## 2018-02-15 DIAGNOSIS — Z79899 Other long term (current) drug therapy: Secondary | ICD-10-CM | POA: Insufficient documentation

## 2018-02-15 DIAGNOSIS — M25462 Effusion, left knee: Secondary | ICD-10-CM

## 2018-02-15 MED ORDER — MELOXICAM 15 MG PO TABS: 15.0000 mg | ORAL_TABLET | Freq: Every day | ORAL | 0 refills | Status: DC

## 2018-02-15 NOTE — Discharge Instructions (Addendum)
Keep your knee elevated while at home, ice several times a day.  Stay off of it as much as possible.  Immobilizer when walking around.  Use crutches as needed.  Follow-up with orthopedic specialist as referred.  Mobic for pain and inflammation as prescribed.

## 2018-02-15 NOTE — ED Triage Notes (Signed)
Patient c/o left knee pain and swelling x 3 days. Patient does not know of a specific injury,but states she does play basketball.

## 2018-02-15 NOTE — ED Provider Notes (Signed)
[facility]-EMERGENCY DEPT Provider Note   CSN: [id number] Arrival date & time: 02/15/18  1629     History   Chief Complaint Chief Complaint  Patient presents with  . Joint Swelling    HPI Jessica Valdez is a 42 y.o. female.  HPI Jessica Valdez is a 42 y.o. female presents to emergency department complaining of left knee pain and swelling.  Patient states that the swelling started approximately 2 days ago.  She reports injuring this same knee "years and years ago."  She states at that time she had an x-ray done and was told she needed an MRI but she could not afford that show she has never had an MRI of that knee.  She states that she did play basketball few days ago but denies injuring her knee.  She denies any pain or swelling distal to the knee.  Specifically denies any pain in her calf or her ankle.  Pain is worse with flexing at the knee joint and with ambulation and bearing weight.  Denies any numbness or weakness distally.  Denies any redness or warmth to the touch.  Denies any fever or chills.  She states this same knee has swelled up in the past, but never this bad.  History reviewed. No pertinent past medical history.  There are no active problems to display for this patient.   History reviewed. No pertinent surgical history.   OB History   None      Home Medications    Prior to Admission medications   Medication Sig Start Date End Date Taking? Authorizing Provider  cyclobenzaprine (FLEXERIL) 10 MG tablet Take 1 tablet (10 mg total) by mouth 2 (two) times daily as needed for muscle spasms. Patient not taking: Reported on 04/09/2016 03/21/16   Fayrene Helper, PA-C  hydrochlorothiazide (HYDRODIURIL) 25 MG tablet Take 0.5 tablets (12.5 mg total) by mouth daily. 11/13/16   Margarita Grizzle, MD  ibuprofen (ADVIL,MOTRIN) 800 MG tablet Take 1 tablet (800 mg total) by mouth 3 (three) times daily. Patient not taking: Reported on 04/09/2016 03/21/16   Fayrene Helper, PA-C    Family History History reviewed. No pertinent family history.  Social History Social History   Tobacco Use  . Smoking status: Never Smoker  . Smokeless tobacco: Never Used  Substance Use Topics  . Alcohol use: No  . Drug use: No     Allergies   Patient has no known allergies.   Review of Systems Review of Systems  Constitutional: Negative for chills and fever.  Respiratory: Negative for cough, chest tightness and shortness of breath.   Cardiovascular: Negative for chest pain, palpitations and leg swelling.  Musculoskeletal: Positive for arthralgias, joint swelling and myalgias. Negative for neck pain and neck stiffness.  Skin: Negative for rash.  Neurological: Negative for dizziness, weakness, numbness and headaches.  All other systems reviewed and are negative.    Physical Exam Updated Vital Signs BP (!) 138/101 (BP Location: Right Arm)   Pulse 65   Temp 98.5 F (36.9 C) (Oral)   Resp 16   Ht 5\' 8"  (1.727 m)   Wt 78 kg (172 lb)   LMP 02/13/2018   SpO2 98%   BMI 26.15 kg/m   Physical Exam  Constitutional: She is oriented to person, place, and time. She appears well-developed and well-nourished. No distress.  Eyes: Conjunctivae are normal.  Neck: Neck supple.  Musculoskeletal:  Obvious joint effusion noted to the left knee.  It is not  warm to the touch, it is not red.  Diffuse tenderness to palpation.  Patella tendon is intact.  No calf tenderness.  Normal ankle.  Joint is stable with negative anterior posterior drawer signs.  No laxity with medial lateral stress.  Neurological: She is alert and oriented to person, place, and time.  Skin: Skin is warm and dry.  Nursing note and vitals reviewed.    ED Treatments / Results  Labs (all labs ordered are listed, but only abnormal results are displayed) Labs Reviewed - No data to display  EKG None  Radiology Dg Knee Complete 4 Views Left  Result Date: 02/15/2018 CLINICAL DATA:  Left knee  pain and swelling for 3 days EXAM: LEFT KNEE - COMPLETE 4+ VIEW COMPARISON:  None. FINDINGS: No acute fracture. No dislocation. Small joint effusion. There is calcified chondroid matrix in the distal femur diaphysis which may be related to a bone infarct. 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 Small joint effusion is noted. Electronically Signed   By: Jolaine ClickArthur  Hoss M.D.   On: 02/15/2018 17:27    Procedures Procedures (including critical care time)  Medications Ordered in ED Medications - No data to display   Initial Impression / Assessment and Plan / ED Course  I have reviewed the triage vital signs and the nursing notes.  Pertinent labs & imaging results that were available during my care of the patient were reviewed by me and considered in my medical decision making (see chart for details).     Patient in emergency department with left knee pain and swelling.  No definite injuries but she did play basketball the day before the swelling started.  She has history of injury to the same knee years ago.  Never has seen an orthopedist or had an MRI of that knee.  No suspicion for infection.  I will get x-rays.  5:41 PM X-ray showing possible small bone infarct, no further treatment at this time is indicated for this.  Small joint effusion is noted otherwise normal x-ray.  Discussed results with patient and plan of placing an immobilizer, crutches, ice and elevation, NSAIDs, follow-up with orthopedic doctor.  Suspect she may have a meniscal tear or or some other injury from the past that was exacerbated by her recently playing basketball.  Vitals:   02/15/18 1658 02/15/18 1700  BP: (!) 138/101   Pulse: 65   Resp: 16   Temp: 98.5 F (36.9 C)   TempSrc: Oral   SpO2: 98%   Weight:  78 kg (172 lb)  Height:  5\' 8"  (1.727 m)     Final Clinical Impressions(s) / ED Diagnoses   Final diagnoses:  Effusion of left knee    ED Discharge Orders    None        Jaynie CrumbleKirichenko, Eduardo Honor, PA-C 02/15/18 1744    Charlynne PanderYao, David Hsienta, MD 02/15/18 2048

## 2018-06-01 ENCOUNTER — Ambulatory Visit: Payer: Self-pay | Admitting: Obstetrics & Gynecology

## 2019-06-13 ENCOUNTER — Encounter: Payer: Self-pay | Admitting: Emergency Medicine

## 2019-06-13 ENCOUNTER — Other Ambulatory Visit: Payer: Self-pay

## 2019-06-13 ENCOUNTER — Ambulatory Visit
Admission: EM | Admit: 2019-06-13 | Discharge: 2019-06-13 | Disposition: A | Discharge status: Home / Self Care | Payer: Self-pay | Attending: Emergency Medicine | Admitting: Emergency Medicine

## 2019-06-13 DIAGNOSIS — G43009 Migraine without aura, not intractable, without status migrainosus: Secondary | ICD-10-CM

## 2019-06-13 DIAGNOSIS — I10 Essential (primary) hypertension: Secondary | ICD-10-CM

## 2019-06-13 HISTORY — DX: Essential (primary) hypertension: I10

## 2019-06-13 MED ORDER — AMLODIPINE BESYLATE 5 MG PO TABS: 5.0000 mg | ORAL_TABLET | Freq: Every day | ORAL | 0 refills | Status: AC

## 2019-06-13 MED ORDER — KETOROLAC TROMETHAMINE 60 MG/2ML IM SOLN
60.0000 mg | Freq: Once | INTRAMUSCULAR | Status: AC
  Administered 2019-06-13: 15:00:00 60 mg via INTRAMUSCULAR

## 2019-06-13 MED ORDER — DEXAMETHASONE SODIUM PHOSPHATE 10 MG/ML IJ SOLN
10.0000 mg | Freq: Once | INTRAMUSCULAR | Status: AC
  Administered 2019-06-13: 15:00:00 10 mg via INTRAMUSCULAR

## 2019-06-13 NOTE — ED Triage Notes (Addendum)
Pt presents to [facility] for assessment of headache x 3 days, checked her BP Monday and it was 175/97 today.  144/109 and 135/98 readings as well.  Patient states she had some leftover HCTZ from her ER visit last year, and took the last few pills over the last few days without relief.  Patient states her wife is a [employer] scientist and has recommended she NOT be started on lisinopril.  Denies CP or shortness of breath

## 2019-06-13 NOTE — ED Provider Notes (Signed)
[facility]    CSN: [id number] Arrival date & time: 06/13/19  1430      History   Chief Complaint Chief Complaint  Patient presents with  . Hypertension    HPI Jessica Valdez is a 43 y.o. female with history of hypertension, migraine presenting for 3-day migraine accompanied by elevated blood pressure readings.  Patient states her migraine is behind her left eye, without photophobia, phonophobia, visual changes, nausea.  Patient states this feels "like my normal migraines ".  Patient usually takes Tylenol, though without as much relief as normal.  Patient was also able to take blood pressure at home and had readings of systolic varying between 138-170, diastolic between 96-222.  Patient denies chest pain, shortness of breath, recent illness or fever, severe abdominal pain, hematuria.  Patient is tolerating hydrochlorothiazide well in the past, though has not been on it in years.  Patient does not currently have primary care.  Past Medical History:  Diagnosis Date  . Hypertension     There are no active problems to display for this patient.   History reviewed. No pertinent surgical history.  OB History   No obstetric history on file.      Home Medications    Prior to Admission medications   Medication Sig Start Date End Date Taking? Authorizing Provider  amLODipine (NORVASC) 5 MG tablet Take 1 tablet (5 mg total) by mouth daily. 06/13/19   Hall-Potvin, [place], PA-C  hydrochlorothiazide (HYDRODIURIL) 25 MG tablet Take 0.5 tablets (12.5 mg total) by mouth daily. 11/13/16   Margarita Grizzle, MD    Family History Family History  Family history unknown: Yes    Social History Social History   Tobacco Use  . Smoking status: Never Smoker  . Smokeless tobacco: Never Used  Substance Use Topics  . Alcohol use: No  . Drug use: No     Allergies   Patient has no known allergies.   Review of Systems Review of Systems  Constitutional: Negative for  fatigue and fever.  HENT: Negative for congestion, ear pain, sinus pain, sore throat and voice change.   Eyes: Negative for photophobia, pain, discharge, redness and visual disturbance.  Respiratory: Negative for cough and shortness of breath.   Cardiovascular: Negative for chest pain and palpitations.  Gastrointestinal: Negative for abdominal pain, diarrhea, nausea and vomiting.  Musculoskeletal: Negative for arthralgias and myalgias.  Skin: Negative for rash and wound.  Neurological: Positive for headaches. Negative for dizziness, tremors, syncope, facial asymmetry, speech difficulty, weakness, light-headedness and numbness.  Hematological: Does not bruise/bleed easily.  Psychiatric/Behavioral: Negative for confusion and sleep disturbance.     Physical Exam Triage Vital Signs ED Triage Vitals [06/13/19 1445]  Enc Vitals Group     BP (!) 172/102     Pulse Rate 76     Resp 18     Temp 98 F (36.7 C)     Temp Source Temporal     SpO2 98 %     Weight      Height      Head Circumference      Peak Flow      Pain Score 10     Pain Loc      Pain Edu?      Excl. in GC?    No data found.  Updated Vital Signs BP (!) 164/107 (BP Location: Left Arm)   Pulse 76   Temp 98 F (36.7 C) (Temporal)   Resp 18   SpO2 98%  Physical Exam Vitals signs reviewed.  Constitutional:      General: She is not in acute distress.    Appearance: She is normal weight. She is not ill-appearing.  HENT:     Head: Normocephalic and atraumatic.     Right Ear: Tympanic membrane, ear canal and external ear normal.     Left Ear: Tympanic membrane, ear canal and external ear normal.     Nose: Nose normal.     Mouth/Throat:     Mouth: Mucous membranes are moist.     Pharynx: Oropharynx is clear.  Eyes:     General: No scleral icterus.       Right eye: No discharge.        Left eye: No discharge.     Extraocular Movements: Extraocular movements intact.     Pupils: Pupils are equal, round, and  reactive to light.  Neck:     Musculoskeletal: Normal range of motion and neck supple. No muscular tenderness.     Vascular: No carotid bruit.     Comments: Negative JVD Cardiovascular:     Rate and Rhythm: Normal rate and regular rhythm.     Pulses: Normal pulses.     Heart sounds: No murmur.  Pulmonary:     Effort: Pulmonary effort is normal. No respiratory distress.     Breath sounds: No wheezing.  Chest:     Chest wall: No tenderness.  Abdominal:     General: Abdomen is flat.     Palpations: Abdomen is soft.     Tenderness: There is no abdominal tenderness. There is no guarding.  Musculoskeletal: Normal range of motion.     Right lower leg: No edema.     Left lower leg: No edema.  Lymphadenopathy:     Cervical: No cervical adenopathy.  Skin:    General: Skin is warm.     Capillary Refill: Capillary refill takes less than 2 seconds.     Coloration: Skin is not jaundiced or pale.     Findings: No rash.  Neurological:     General: No focal deficit present.     Mental Status: She is alert and oriented to person, place, and time.     Cranial Nerves: No cranial nerve deficit.     Sensory: No sensory deficit.     Motor: No weakness.     Coordination: Coordination normal.     Gait: Gait normal.     Deep Tendon Reflexes: Reflexes normal.  Psychiatric:        Mood and Affect: Mood normal.        Thought Content: Thought content normal.      UC Treatments / Results  Labs (all labs ordered are listed, but only abnormal results are displayed) Labs Reviewed - No data to display  EKG   Radiology No results found.  Procedures Procedures (including critical care time)  Medications Ordered in UC Medications  ketorolac (TORADOL) injection 60 mg (60 mg Intramuscular Given 06/13/19 1519)  dexamethasone (DECADRON) injection 10 mg (10 mg Intramuscular Given 06/13/19 1519)    Initial Impression / Assessment and Plan / UC Course  I have reviewed the triage vital signs and  the nursing notes.  Pertinent labs & imaging results that were available during my care of the patient were reviewed by me and considered in my medical decision making (see chart for details).     No neurocognitive deficits on exam, denies history of coagulopathy, no recent NSAID use:  IM Toradol,  Decadron given in office which patient tolerated well.  Patient observed for 20 minutes and reports improvement in migrainous symptoms at time of discharge.  Provided contact information for primary care with instructions to make 2-week follow-up.  Patient denies history of cardiac conditions, CAD.  This provider reviewed EKG from 11/13/2016: Sinus rhythm without ST elevation, QTc prolongation or significant waveform abnormality.  Will start amlodipine daily and have patient continue to monitor blood pressure.  ER return precautions discussed, patient verbalized understanding and is agreeable to plan. Final Clinical Impressions(s) / UC Diagnoses   Final diagnoses:  Essential hypertension  Migraine without aura and without status migrainosus, not intractable     Discharge Instructions     Take amlodipine once daily. Important to take medication same time every day and keep blood pressure log to bring with you to PCP appointment. Go to ER for worsening headache, change in vision, chest pain, shortness of breath, vomiting, severe abdominal pain.    ED Prescriptions    Medication Sig Dispense Auth. Provider   amLODipine (NORVASC) 5 MG tablet Take 1 tablet (5 mg total) by mouth daily. 30 tablet Hall-Potvin, [place], PA-C     PDMP not reviewed this encounter.   Hall-Potvin, [place], [place] 06/13/19 1605

## 2019-06-13 NOTE — ED Notes (Signed)
Patient able to ambulate independently  

## 2019-06-13 NOTE — Discharge Instructions (Addendum)
Take amlodipine once daily. Important to take medication same time every day and keep blood pressure log to bring with you to PCP appointment. Go to ER for worsening headache, change in vision, chest pain, shortness of breath, vomiting, severe abdominal pain.

## 2019-07-09 ENCOUNTER — Other Ambulatory Visit (HOSPITAL_BASED_OUTPATIENT_CLINIC_OR_DEPARTMENT_OTHER): Payer: Self-pay | Admitting: Physician Assistant

## 2019-07-09 DIAGNOSIS — Z1231 Encounter for screening mammogram for malignant neoplasm of breast: Secondary | ICD-10-CM

## 2019-07-10 ENCOUNTER — Ambulatory Visit (HOSPITAL_BASED_OUTPATIENT_CLINIC_OR_DEPARTMENT_OTHER)
Admission: RE | Admit: 2019-07-10 | Discharge: 2019-07-10 | Disposition: A | Discharge status: Home / Self Care | Payer: BC Managed Care – PPO | Source: Ambulatory Visit | Attending: Physician Assistant | Admitting: Physician Assistant

## 2019-07-10 ENCOUNTER — Encounter (HOSPITAL_BASED_OUTPATIENT_CLINIC_OR_DEPARTMENT_OTHER): Payer: Self-pay

## 2019-07-10 ENCOUNTER — Other Ambulatory Visit: Payer: Self-pay

## 2019-07-10 DIAGNOSIS — Z1231 Encounter for screening mammogram for malignant neoplasm of breast: Secondary | ICD-10-CM | POA: Insufficient documentation

## 2020-03-14 IMAGING — MG DIGITAL SCREENING BILAT W/ TOMO W/ CAD
6 of 12 series · 6 of 36 positions shown · non-contrast
Comparison: None.

CLINICAL DATA: Screening.

EXAM:
DIGITAL SCREENING BILATERAL MAMMOGRAM WITH TOMO AND CAD

[R CC synth-2D]
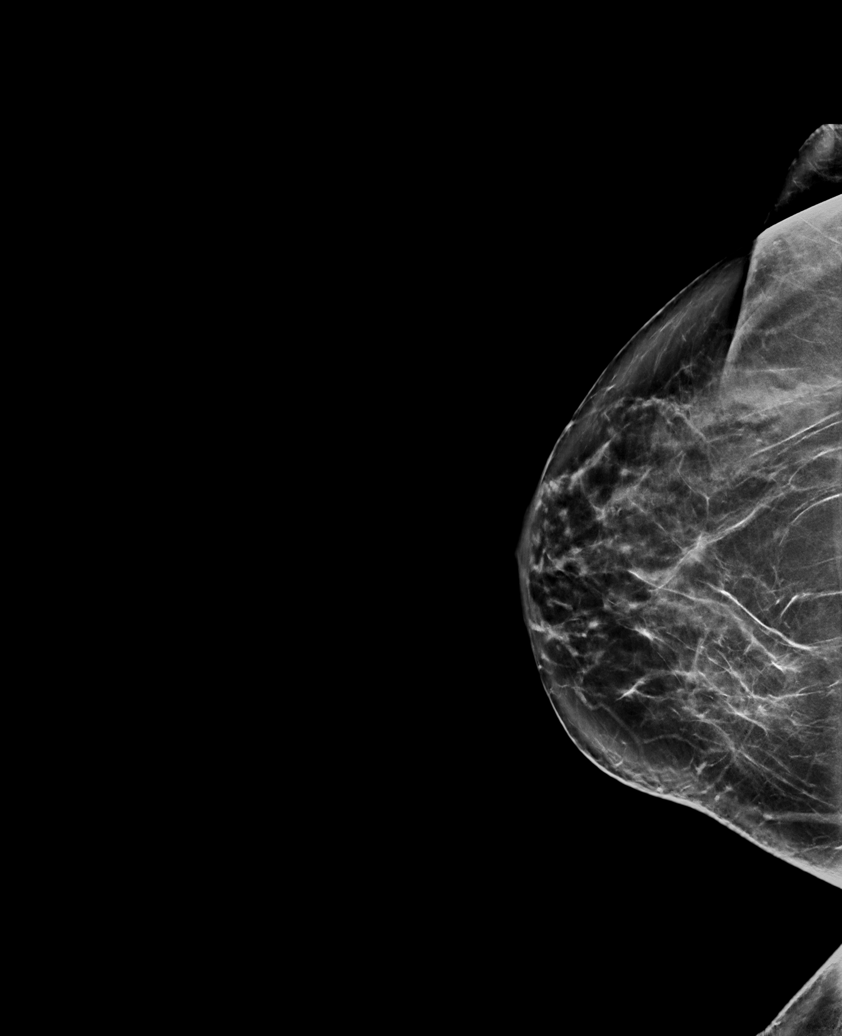

[L XCCL synth-2D]
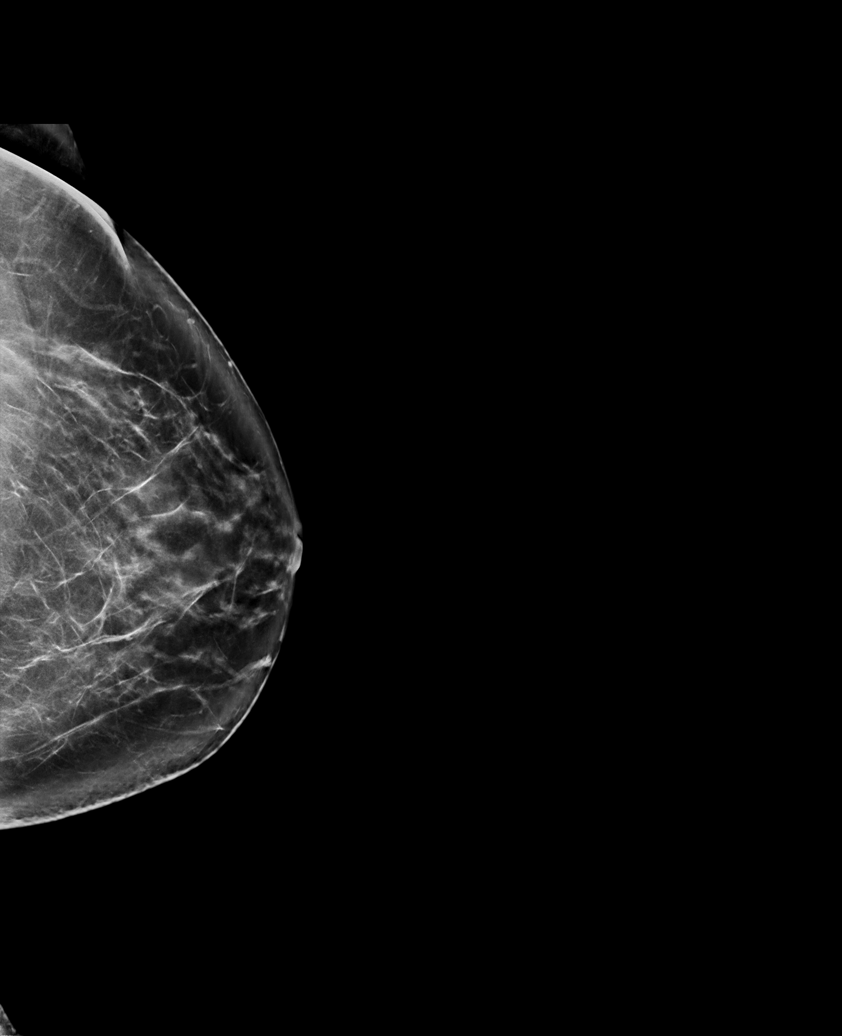

[R XCCL synth-2D]
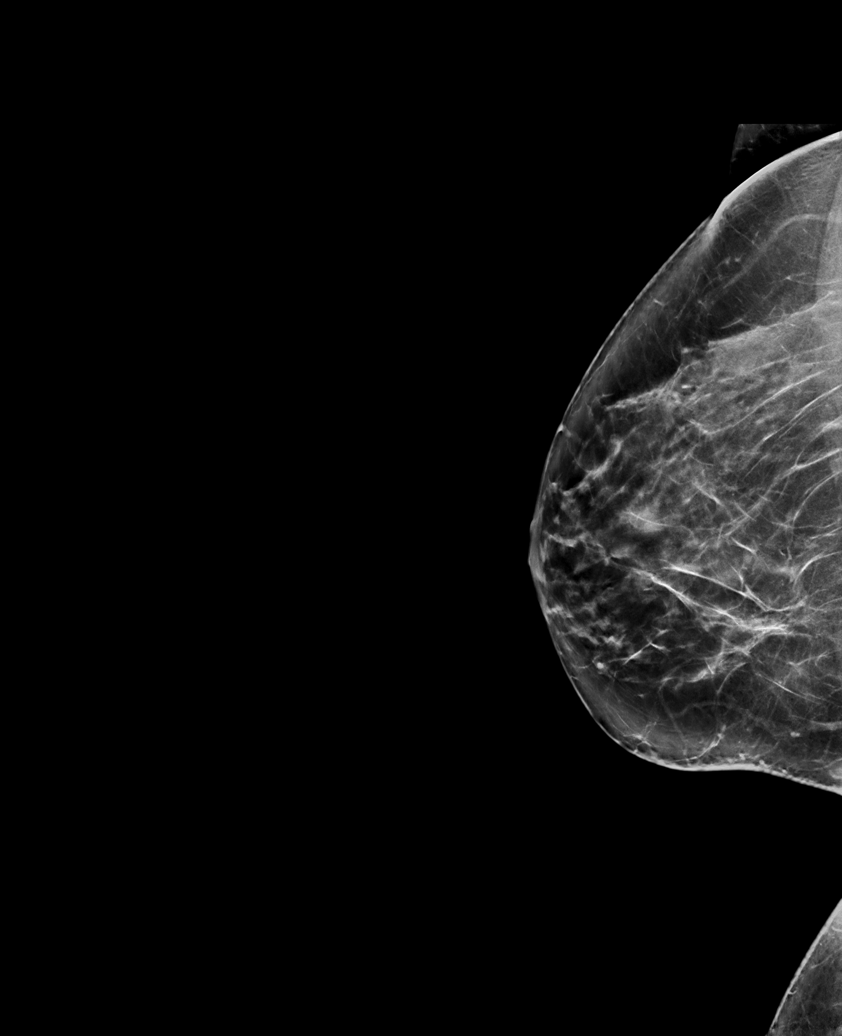

[R MLO synth-2D]
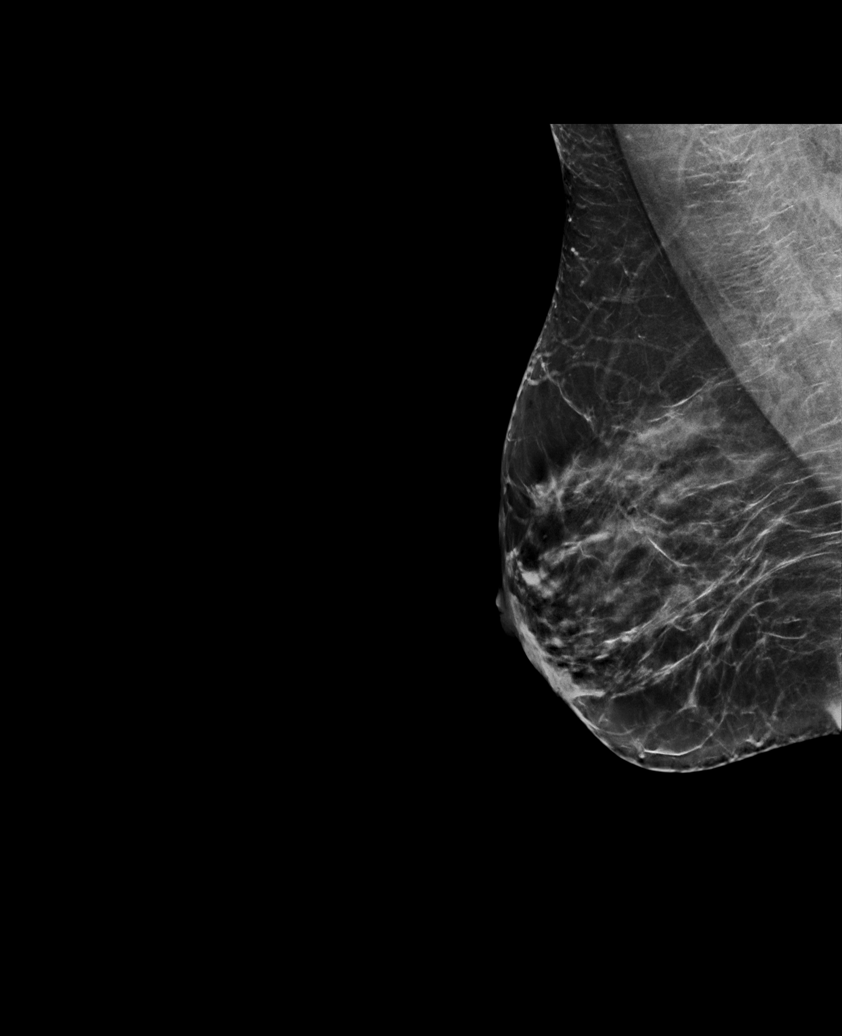

[L MLO synth-2D]
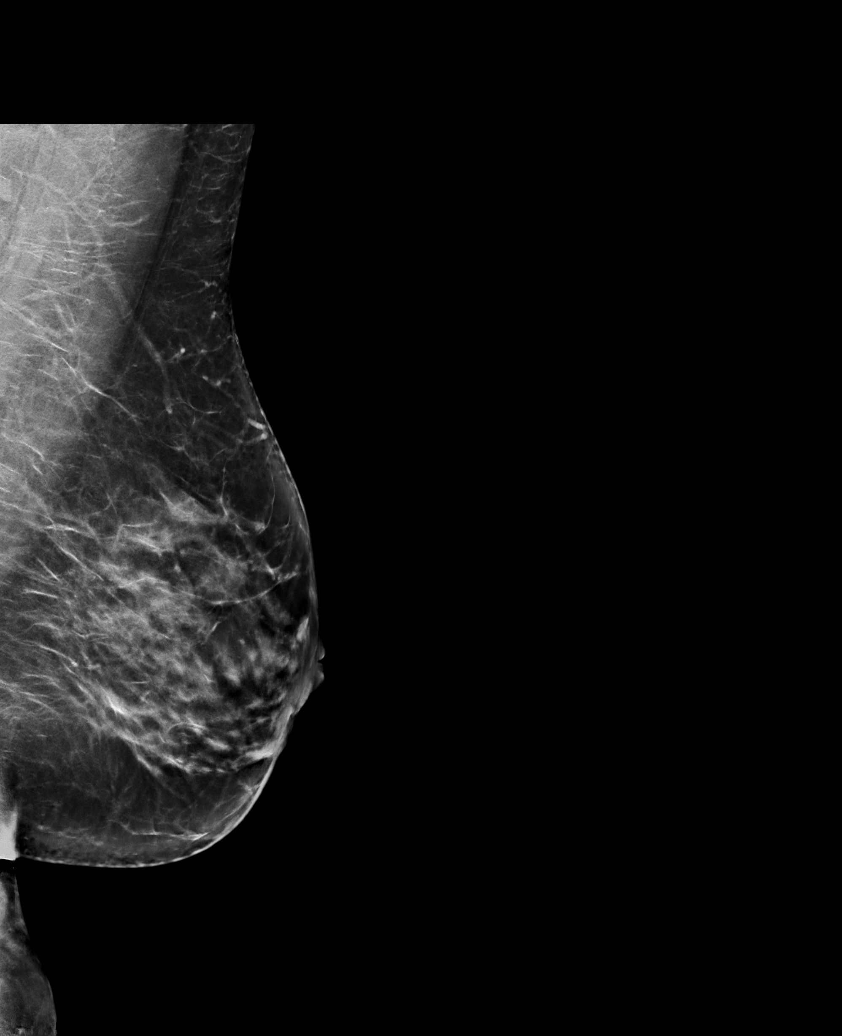

[L CC synth-2D]
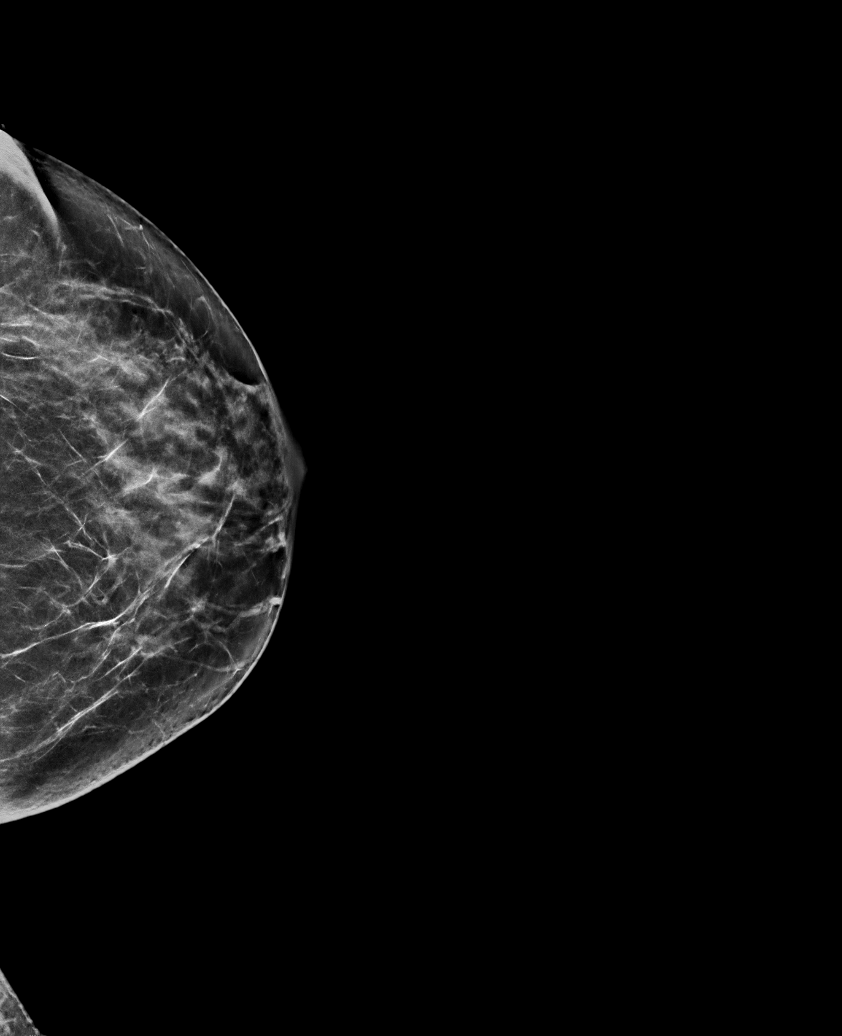

[6 of 36 positions shown; findings below may reference images not displayed]

ACR Breast Density Category c: The breast tissue is heterogeneously
dense, which may obscure small masses
FINDINGS: There are no findings suspicious for malignancy. Images were
processed with CAD.
IMPRESSION: No mammographic evidence of malignancy. A result letter of this
screening mammogram will be mailed directly to the patient.

RECOMMENDATION:
Screening mammogram in one year. (Code:EM-2-IHY)

BI-RADS CATEGORY  1: Negative.

## 2021-10-08 ENCOUNTER — Encounter (INDEPENDENT_AMBULATORY_CARE_PROVIDER_SITE_OTHER): Payer: Self-pay

## 2021-10-14 ENCOUNTER — Encounter (INDEPENDENT_AMBULATORY_CARE_PROVIDER_SITE_OTHER): Payer: Self-pay | Admitting: Ophthalmology

## 2021-10-14 ENCOUNTER — Other Ambulatory Visit: Payer: Self-pay

## 2021-10-14 ENCOUNTER — Ambulatory Visit (INDEPENDENT_AMBULATORY_CARE_PROVIDER_SITE_OTHER): Payer: BC Managed Care – PPO | Admitting: Ophthalmology

## 2021-10-14 DIAGNOSIS — H35353 Cystoid macular degeneration, bilateral: Secondary | ICD-10-CM

## 2021-10-14 DIAGNOSIS — H359 Unspecified retinal disorder: Secondary | ICD-10-CM

## 2021-10-14 DIAGNOSIS — H3552 Pigmentary retinal dystrophy: Secondary | ICD-10-CM | POA: Diagnosis not present

## 2021-10-14 MED ORDER — FLUORESCEIN SODIUM 10 % IV SOLN
500.0000 mg | INTRAVENOUS | Status: AC | PRN
  Administered 2021-10-14: 500 mg via INTRAVENOUS

## 2021-10-14 MED ORDER — METHAZOLAMIDE 25 MG PO TABS: 25.0000 mg | ORAL_TABLET | Freq: Three times a day (TID) | ORAL | 1 refills | Status: DC

## 2021-10-14 NOTE — Assessment & Plan Note (Signed)
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  Nonspecific findings supporting this diagnosis included diffuse attenuated arteries, no retinal vasculitis no signs of obvious retinitis.  Night vision difficulties are present.  Patient will need full visual field testing peripherally likely done RA with Dr. Venetia Maxon  We will test for syphilis simply to look for disorders that could account for this but not likely in this case

## 2021-10-14 NOTE — Progress Notes (Addendum)
10/14/2021     CHIEF COMPLAINT Patient presents for  Chief Complaint  Patient presents with   Retina Evaluation      HISTORY OF PRESENT ILLNESS: Jessica Valdez is a 46 y.o. female who presents to the clinic today for:   HPI     Retina Evaluation           Laterality: both eyes   Associated Symptoms: Flashes and Floaters         Comments   NP- macular edema OU, unknown cause, referred by Dr. Venetia Maxon. "I went to the [facility] to get my license and I couldn't see for the vision test. I went to Dr. Venetia Maxon because my glasses weren't working, and he said I have swelling in the back of my eye. I can't see well at night. I see flashes sometimes mostly at night, and floaters, I have seen maybe twice."      Last edited by Laurin Coder on 10/14/2021  3:28 PM.      Referring physician: Marylynn Pearson, MD [facility] [place],  [place] [address]  HISTORICAL INFORMATION:   Selected notes from the [facility]: No current outpatient medications on file. (Ophthalmic Drugs)   No current facility-administered medications for this visit. (Ophthalmic Drugs)   Current Outpatient Medications (Other)  Medication Sig   amLODipine (NORVASC) 5 MG tablet Take 1 tablet (5 mg total) by mouth daily.   hydrochlorothiazide (HYDRODIURIL) 25 MG tablet Take 0.5 tablets (12.5 mg total) by mouth daily.   methazolamide (NEPTAZANE) 25 MG tablet Take 1 tablet (25 mg total) by mouth 3 (three) times daily.   No current facility-administered medications for this visit. (Other)      REVIEW OF SYSTEMS: ROS   Negative for: Constitutional, Gastrointestinal, Neurological, Skin, Genitourinary, Musculoskeletal, HENT, Endocrine, Cardiovascular, Eyes, Respiratory, Psychiatric, Allergic/Imm, Heme/Lymph Last edited by Hurman Horn, MD on 10/14/2021  3:54 PM.       ALLERGIES No Known Allergies  PAST MEDICAL HISTORY Past Medical History:  Diagnosis  Date   Hypertension    History reviewed. No pertinent surgical history.  FAMILY HISTORY Family History  Family history unknown: Yes    SOCIAL HISTORY Social History   Tobacco Use   Smoking status: Never   Smokeless tobacco: Never  Vaping Use   Vaping Use: Never used  Substance Use Topics   Alcohol use: No   Drug use: No         OPHTHALMIC EXAM:  Base Eye Exam     Visual Acuity (ETDRS)       Right Left   Dist [place] 20/70 -1 20/100 -1   Dist ph [place] NI NI         Tonometry (Tonopen, 3:32 PM)       Right Left   Pressure 16 14         Pupils       Pupils Dark Light Shape React APD   Right PERRL 4 3 Round Slow None   Left PERRL 4 3 Round Slow None         Visual Fields (Counting fingers)       Left Right    Full Full         Extraocular Movement       Right Left    Full Full         Neuro/Psych     Mood/Affect: Normal  Dilation     Both eyes: 1.0% Mydriacyl, 2.5% Phenylephrine @ 3:32 PM           Slit Lamp and Fundus Exam     External Exam       Right Left   External Normal Normal         Slit Lamp Exam       Right Left   Lids/Lashes Normal Normal   Conjunctiva/Sclera White and quiet White and quiet   Cornea Clear Clear   Anterior Chamber Deep and quiet, no cells Deep and quiet, no cells   Iris Round and reactive Round and reactive   Lens Trace Nuclear sclerosis Trace Nuclear sclerosis   Anterior Vitreous No cells in the anterior vitreous No cells in the anterior vitreous         Fundus Exam       Right Left   Posterior Vitreous Normal Normal   Disc Normal, no waxy pallor Normal, no waxy pallor   C/D Ratio 0.35 0.35   Macula Cystoid macular edema Cystoid macular edema   Vessels Essentially normal may have slight attenuation of arteries diffusely Essentially normal may have slight attenuation of arteries diffusely   Periphery Normal, no peripheral pigmentation Normal, no peripheral pigmentation             IMAGING AND PROCEDURES  Imaging and Procedures for 10/15/21  OCT, Retina - OU - Both Eyes       Right Eye Quality was good. Scan locations included subfoveal. Central Foveal Thickness: 253. Progression has no prior data. Findings include abnormal foveal contour, cystoid macular edema.   Left Eye Quality was good. Scan locations included subfoveal. Central Foveal Thickness: 390. Progression has no prior data. Findings include abnormal foveal contour, cystoid macular edema.   Notes OD and OS with overall CME centrally yet diffuse macular atrophy also notable peripheral to the central 3 mm of the macula.  Loss of the EZ layer, photoreceptor layer     Color Fundus Photography Optos - OU - Both Eyes       Right Eye Progression has no prior data. Disc findings include normal observations. Macula : edema. Vessels : attenuated. Periphery : normal observations.   Left Eye Progression has no prior data. Disc findings include normal observations. Macula : edema. Vessels : attenuated. Periphery : normal observations.   Notes Finally attenuated retinal arteries, nonspecific however  Cystoid macular edema OU  No bone spicule pigmentation peripherally     Fluorescein Angiography Optos (Transit OD)       Injection: 500 mg Fluorescein Sodium 10 %   Route: Intravenous   NDC: [phone]   Right Eye   Progression has no prior data. Early phase findings include window defect. Mid/Late phase findings include window defect, leakage. Choroidal neovascularization is not present.   Left Eye   Progression has no prior data. Mid/Late phase findings include leakage, window defect. Choroidal neovascularization is not present.   Notes Bilateral late leakage In and around the macula of CME, disproportionate to the amount seen on OCT thus this may be an outer retinal RPE induced CME and not a microvascular CME in the macula  Diffuse RPE window defects throughout the posterior  pole.  No obvious vasculitis.  Thin attenuated arteries throughout.  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  Nonspecific findings supporting this diagnosis included diffuse attenuated arteries, no retinal vasculitis no signs of obvious retinitis.  Night vision difficulties are present.  Patient will need full visual field testing peripherally likely done RA with Dr. 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  OCT shows diffuse loss of the ellipsoid zone, photoreceptor layers outside of the foveal region, and this is typically seen in hereditary retinal degeneration's.  Retinitis pigmentosa with secondary CME cannot be ruled out and will need further investigation   We will commence therapy OD empirically with methazolamide 25 mg 1 tablet p.o. 3 times daily  If CME does improve this would suggest this is a hereditary retinal degeneration with secondary CME in this phakic eye  Retinitis pigmentosa, both eyes Diffuse macular atrophy with central CME and some nighttime difficulties by history  Will rule out masquerade syndrome and check for syphilis and HIV  Will likely need formal ERG testing, will arrange at [place]   1. CME (cystoid macular edema), bilateral  H35.353 OCT, Retina - OU - Both Eyes    Color Fundus Photography Optos - OU - Both Eyes    Fluorescein Angiography Optos (Transit OD)    Fluorescein Sodium 10 % injection 500 mg    2. Generalized retinal degeneration  H35.9 CBC with Differential/Platelet    Sedimentation rate     RPR+HBsAg+HIV    3. Cystoid macular edema of both eyes  H35.353     4. Retinitis pigmentosa, both eyes  H35.52       1.  Generalized retinal degeneration with RPE window defects diffusely on fluorescein angiography.  No signs of bone spicule pigmentation for classic retinitis pigmentosa nonetheless with bilateral CME appears to be very active by OCT but not vascularly active thus this may be outer retinal degeneration.  Moreover the OCT shows loss of outer retinal layers in a diffuse pattern in the posterior pole.  This is also suggestive of a genetic hereditary retinal degeneration.  2.  We will check for syphilis, HIV as well as hepatitis B which can often lead to a diffuse retinal vasculitis although this is not imaged today on angiography  3.  We will likely need to have consultation at [facility] to have formal testing of ERG and/or EOG to further quantify this disorder  Ophthalmic Meds Ordered this visit:  Meds ordered this encounter  Medications   Fluorescein Sodium 10 % injection 500 mg   DISCONTD: methazolamide (NEPTAZANE) 25 MG tablet    Sig: Take 1 tablet (25 mg total) by mouth 3 (three) times daily.    Dispense:  1 tablet    Refill:  1   methazolamide (NEPTAZANE) 25 MG tablet    Sig: Take 1 tablet (25 mg total) by mouth 3 (three) times daily.    Dispense:  90 tablet    Refill:  3       Return in about 2 weeks (around 10/28/2021) for DILATE OU, OCT.  There are no Patient Instructions on file for this visit.   Explained the diagnoses, plan, and follow up with the patient and they expressed understanding.  Patient expressed understanding of the importance of proper follow up care.   Clent Demark Hanae Waiters M.D. Diseases &  Surgery of the Retina and Vitreous [facility] 10/15/21     Abbreviations: M myopia (nearsighted); A astigmatism; H hyperopia (farsighted); P presbyopia; Mrx spectacle prescription;  CTL contact lenses; OD right eye; OS left eye; OU  both eyes  XT exotropia; ET esotropia; PEK punctate epithelial keratitis; PEE punctate epithelial erosions; DES dry eye syndrome; MGD meibomian gland dysfunction; ATs artificial tears; PFAT's preservative free artificial tears; [place] nuclear sclerotic cataract; PSC posterior subcapsular cataract; ERM epi-retinal membrane; PVD posterior vitreous detachment; RD retinal detachment; DM diabetes mellitus; DR diabetic retinopathy; NPDR non-proliferative diabetic retinopathy; PDR proliferative diabetic retinopathy; CSME clinically significant macular edema; DME diabetic macular edema; dbh dot blot hemorrhages; CWS cotton wool spot; POAG primary open angle glaucoma; C/D cup-to-disc ratio; HVF humphrey visual field; GVF goldmann visual field; OCT optical coherence tomography; IOP intraocular pressure; BRVO Branch retinal vein occlusion; CRVO central retinal vein occlusion; CRAO central retinal artery occlusion; BRAO branch retinal artery occlusion; RT retinal tear; SB scleral buckle; PPV pars plana vitrectomy; VH Vitreous hemorrhage; PRP panretinal laser photocoagulation; IVK intravitreal kenalog; VMT vitreomacular traction; MH Macular hole;  NVD neovascularization of the disc; NVE neovascularization elsewhere; AREDS age related eye disease study; ARMD age related macular degeneration; POAG primary open angle glaucoma; EBMD epithelial/anterior basement membrane dystrophy; ACIOL anterior chamber intraocular lens; IOL intraocular lens; PCIOL posterior chamber intraocular lens; Phaco/IOL phacoemulsification with intraocular lens placement; [place] photorefractive keratectomy; LASIK laser assisted in situ keratomileusis; HTN hypertension; DM diabetes mellitus; COPD chronic obstructive pulmonary disease

## 2021-10-15 DIAGNOSIS — H35353 Cystoid macular degeneration, bilateral: Secondary | ICD-10-CM | POA: Insufficient documentation

## 2021-10-15 DIAGNOSIS — H3552 Pigmentary retinal dystrophy: Secondary | ICD-10-CM | POA: Insufficient documentation

## 2021-10-15 MED ORDER — METHAZOLAMIDE 25 MG PO TABS: 25.0000 mg | ORAL_TABLET | Freq: Three times a day (TID) | ORAL | 3 refills | Status: AC

## 2021-10-15 NOTE — Assessment & Plan Note (Addendum)
Diffuse macular atrophy with central CME and some nighttime difficulties by history  Will rule out masquerade syndrome and check for syphilis and HIV  Will likely need formal ERG testing, will arrange at [facility]

## 2021-10-15 NOTE — Assessment & Plan Note (Addendum)
Bilateral idiopathic cystoid macular edema associated with adjacent surrounding diffuse retinal macular atrophy.  OCT shows diffuse loss of the ellipsoid zone, photoreceptor layers outside of the foveal region, and this is typically seen in hereditary retinal degeneration's.  Retinitis pigmentosa with secondary CME cannot be ruled out and will need further investigation   We will commence therapy OD empirically with methazolamide 25 mg 1 tablet p.o. 3 times daily  If CME does improve this would suggest this is a hereditary retinal degeneration with secondary CME in this phakic eye

## 2021-10-15 NOTE — Addendum Note (Signed)
Addended by: Fawn Kirk A on: 10/15/2021 08:15 AM   Modules accepted: Orders

## 2021-10-21 LAB — CBC WITH DIFFERENTIAL/PLATELET
Basophils Absolute: 0.1 10*3/uL (ref 0.0–0.2)
Basos: 1 %
EOS (ABSOLUTE): 0.1 10*3/uL (ref 0.0–0.4)
Eos: 3 %
Hematocrit: 37.7 % (ref 34.0–46.6)
Hemoglobin: 12.2 g/dL (ref 11.1–15.9)
Immature Grans (Abs): 0 10*3/uL (ref 0.0–0.1)
Immature Granulocytes: 0 %
Lymphocytes Absolute: 2.2 10*3/uL (ref 0.7–3.1)
Lymphs: 40 %
MCH: 22.6 pg — ABNORMAL LOW (ref 26.6–33.0)
MCHC: 32.4 g/dL (ref 31.5–35.7)
MCV: 70 fL — ABNORMAL LOW (ref 79–97)
Monocytes Absolute: 0.5 10*3/uL (ref 0.1–0.9)
Monocytes: 10 %
Neutrophils Absolute: 2.5 10*3/uL (ref 1.4–7.0)
Neutrophils: 46 %
Platelets: 325 10*3/uL (ref 150–450)
RBC: 5.39 x10E6/uL — ABNORMAL HIGH (ref 3.77–5.28)
RDW: 14.8 % (ref 11.7–15.4)
WBC: 5.4 10*3/uL (ref 3.4–10.8)

## 2021-10-21 LAB — SEDIMENTATION RATE: Sed Rate: 27 mm/hr (ref 0–32)

## 2021-10-21 LAB — RPR+HBSAG+HIV
HIV Screen 4th Generation wRfx: NONREACTIVE
Hepatitis B Surface Ag: NEGATIVE
RPR Ser Ql: NONREACTIVE

## 2021-10-29 ENCOUNTER — Encounter (INDEPENDENT_AMBULATORY_CARE_PROVIDER_SITE_OTHER): Payer: BC Managed Care – PPO | Admitting: Ophthalmology

## 2021-11-05 ENCOUNTER — Ambulatory Visit (INDEPENDENT_AMBULATORY_CARE_PROVIDER_SITE_OTHER): Payer: BC Managed Care – PPO | Admitting: Ophthalmology

## 2021-11-05 ENCOUNTER — Encounter (INDEPENDENT_AMBULATORY_CARE_PROVIDER_SITE_OTHER): Payer: Self-pay | Admitting: Ophthalmology

## 2021-11-05 ENCOUNTER — Other Ambulatory Visit: Payer: Self-pay

## 2021-11-05 DIAGNOSIS — H35353 Cystoid macular degeneration, bilateral: Secondary | ICD-10-CM

## 2021-11-05 DIAGNOSIS — H359 Unspecified retinal disorder: Secondary | ICD-10-CM

## 2021-11-05 DIAGNOSIS — H3552 Pigmentary retinal dystrophy: Secondary | ICD-10-CM

## 2021-11-05 MED ORDER — KETOROLAC TROMETHAMINE 0.5 % OP SOLN: 1.0000 [drp] | Freq: Four times a day (QID) | OPHTHALMIC | 0 refills | Status: AC

## 2021-11-05 NOTE — Assessment & Plan Note (Signed)
Bilateral CME continues, although O D is slightly worse today on oral methazolamide, OS slightly less thickening and slightly improved acuity. ? ?May consider empiric use of topical NSAID ? ? ?

## 2021-11-05 NOTE — Progress Notes (Signed)
11/05/2021     CHIEF COMPLAINT Patient presents for  Chief Complaint  Patient presents with   Cystoid Macular Edema   With suspected history of hereditary retinal degeneration with diffuse macular retinal atrophy noted on OCT outside of the area of central CME, yet clinically no sign of bone spicule changes or chorioretinal atrophy peripherally   HISTORY OF PRESENT ILLNESS: Jessica Valdez is a 46 y.o. female who presents to the clinic today for:   HPI   3 week fu ou oct Pt states vision is still the same Pt is experiencing floaters and FOL in OU. Pt states no changes in medical history.    Last edited by Edmon Crape, MD on 11/05/2021  8:13 AM.      Referring physician: Nathaneil Canary, PA-C [address] [place],  [place] [address]  HISTORICAL INFORMATION:   Selected notes from the MEDICAL RECORD NUMBER       CURRENT MEDICATIONS: Current Outpatient Medications (Ophthalmic Drugs)  Medication Sig   ketorolac (ACULAR) 0.5 % ophthalmic solution Place 1 drop into both eyes 4 (four) times daily.   No current facility-administered medications for this visit. (Ophthalmic Drugs)   Current Outpatient Medications (Other)  Medication Sig   amLODipine (NORVASC) 5 MG tablet Take 1 tablet (5 mg total) by mouth daily.   hydrochlorothiazide (HYDRODIURIL) 25 MG tablet Take 0.5 tablets (12.5 mg total) by mouth daily.   methazolamide (NEPTAZANE) 25 MG tablet Take 1 tablet (25 mg total) by mouth 3 (three) times daily.   No current facility-administered medications for this visit. (Other)      REVIEW OF SYSTEMS: ROS   Negative for: Constitutional, Gastrointestinal, Neurological, Skin, Genitourinary, Musculoskeletal, HENT, Endocrine, Cardiovascular, Eyes, Respiratory, Psychiatric, Allergic/Imm, Heme/Lymph Last edited by Angeline Slim on 11/05/2021  7:45 AM.       ALLERGIES No Known Allergies  PAST MEDICAL HISTORY Past Medical History:  Diagnosis Date    Hypertension    History reviewed. No pertinent surgical history.  FAMILY HISTORY Family History  Family history unknown: Yes    SOCIAL HISTORY Social History   Tobacco Use   Smoking status: Never   Smokeless tobacco: Never  Vaping Use   Vaping Use: Never used  Substance Use Topics   Alcohol use: No   Drug use: No         OPHTHALMIC EXAM:  Base Eye Exam     Visual Acuity (ETDRS)       Right Left   Dist [place] 20/60 20/60 -2   Dist ph [place] NI NI         Tonometry (Tonopen, 7:51 AM)       Right Left   Pressure 12 14         Pupils       Pupils Dark Light Shape React APD   Right PERRL 4 3 Round Slow None   Left PERRL 4 3 Round Slow None         Visual Fields       Left Right    Full Full         Extraocular Movement       Right Left    Full Full         Neuro/Psych     Oriented x3: Yes   Mood/Affect: Normal         Dilation     Both eyes: 1.0% Mydriacyl, 2.5% Phenylephrine @ 7:52 AM  Slit Lamp and Fundus Exam     External Exam       Right Left   External Normal Normal         Slit Lamp Exam       Right Left   Lids/Lashes Normal Normal   Conjunctiva/Sclera White and quiet White and quiet   Cornea Clear Clear   Anterior Chamber Deep and quiet, no cells Deep and quiet, no cells   Iris Round and reactive Round and reactive   Lens Trace Nuclear sclerosis Trace Nuclear sclerosis   Anterior Vitreous No cells in the anterior vitreous No cells in the anterior vitreous         Fundus Exam       Right Left   Posterior Vitreous Normal Normal   Disc Normal, no waxy pallor Normal, no waxy pallor   C/D Ratio 0.35 0.35   Macula Cystoid macular edema Cystoid macular edema   Vessels Essentially normal may have slight attenuation of arteries diffusely Essentially normal may have slight attenuation of arteries diffusely   Periphery Normal, no peripheral pigmentation Normal, no peripheral pigmentation             IMAGING AND PROCEDURES  Imaging and Procedures for 11/05/21  OCT, Retina - OU - Both Eyes       Right Eye Quality was good. Scan locations included subfoveal. Central Foveal Thickness: 354. Progression has no prior data. Findings include abnormal foveal contour, cystoid macular edema.   Left Eye Quality was good. Scan locations included subfoveal. Central Foveal Thickness: 374. Progression has no prior data. Findings include abnormal foveal contour, cystoid macular edema.   Notes OD and OS with overall CME centrally yet diffuse macular atrophy also notable peripheral to the central 3 mm of the macula.  Loss of the EZ layer, photoreceptor layer               ASSESSMENT/PLAN:  Generalized retinal degeneration OU, this is a clinical diagnosis thus far, pending formal ERG testing,  Retinitis pigmentosa, both eyes OU, this is a clinical diagnosis thus far with bilateral CME and otherwise diffuse retinal atrophy throughout the posterior pole, with some night vision difficulties.  Pending formal evaluation with ERG testing  Patient started on low-dose oral methazolamide An attempt to assist in clearance or control of CME  CME (cystoid macular edema), bilateral Bilateral CME continues, although O D is slightly worse today on oral methazolamide, OS slightly less thickening and slightly improved acuity.  May consider empiric use of topical NSAID       ICD-10-CM   1. CME (cystoid macular edema), bilateral  H35.353 OCT, Retina - OU - Both Eyes    2. Generalized retinal degeneration  H35.9     3. Retinitis pigmentosa, both eyes  H35.52       1.  Continue methazolamide 1 tablet p.o. 3 times daily  2.  We will use topical empiric NSAID, ketorolac 1 drop OU 4 times daily, as a clinical trial briefly to look for resolution or improvement in CME  3.  We will begin the process of clinically scheduling appointment at [facility], for formal electrophysiologic testing in  a academic setting and retinal consultation  Ophthalmic Meds Ordered this visit:  Meds ordered this encounter  Medications   ketorolac (ACULAR) 0.5 % ophthalmic solution    Sig: Place 1 drop into both eyes 4 (four) times daily.    Dispense:  5 mL    Refill:  0  Return in about 3 weeks (around 11/26/2021) for DILATE OU, OCT.  There are no Patient Instructions on file for this visit.   Explained the diagnoses, plan, and follow up with the patient and they expressed understanding.  Patient expressed understanding of the importance of proper follow up care.   Alford HighlandGary A. Noely Kuhnle M.D. Diseases & Surgery of the Retina and Vitreous [facility] 11/05/21     Abbreviations: M myopia (nearsighted); A astigmatism; H hyperopia (farsighted); P presbyopia; Mrx spectacle prescription;  CTL contact lenses; OD right eye; OS left eye; OU both eyes  XT exotropia; ET esotropia; PEK punctate epithelial keratitis; PEE punctate epithelial erosions; DES dry eye syndrome; MGD meibomian gland dysfunction; ATs artificial tears; PFAT's preservative free artificial tears; NSC nuclear sclerotic cataract; PSC posterior subcapsular cataract; ERM epi-retinal membrane; PVD posterior vitreous detachment; RD retinal detachment; DM diabetes mellitus; DR diabetic retinopathy; NPDR non-proliferative diabetic retinopathy; PDR proliferative diabetic retinopathy; CSME clinically significant macular edema; DME diabetic macular edema; dbh dot blot hemorrhages; CWS cotton wool spot; POAG primary open angle glaucoma; C/D cup-to-disc ratio; HVF humphrey visual field; GVF goldmann visual field; OCT optical coherence tomography; IOP intraocular pressure; BRVO Branch retinal vein occlusion; CRVO central retinal vein occlusion; CRAO central retinal artery occlusion; BRAO branch retinal artery occlusion; RT retinal tear; SB scleral buckle; PPV pars plana vitrectomy; VH Vitreous hemorrhage; PRP panretinal laser  photocoagulation; IVK intravitreal kenalog; VMT vitreomacular traction; MH Macular hole;  NVD neovascularization of the disc; NVE neovascularization elsewhere; AREDS age related eye disease study; ARMD age related macular degeneration; POAG primary open angle glaucoma; EBMD epithelial/anterior basement membrane dystrophy; ACIOL anterior chamber intraocular lens; IOL intraocular lens; PCIOL posterior chamber intraocular lens; Phaco/IOL phacoemulsification with intraocular lens placement; PRK photorefractive keratectomy; LASIK laser assisted in situ keratomileusis; HTN hypertension; DM diabetes mellitus; COPD chronic obstructive pulmonary disease

## 2021-11-05 NOTE — Assessment & Plan Note (Signed)
OU, this is a clinical diagnosis thus far, pending formal ERG testing, ?

## 2021-11-05 NOTE — Assessment & Plan Note (Addendum)
OU, this is a clinical diagnosis thus far with bilateral CME and otherwise diffuse retinal atrophy throughout the posterior pole, with some night vision difficulties. ? ?Pending formal evaluation with ERG testing ? ?Patient started on low-dose oral methazolamide An attempt to assist in clearance or control of CME ?

## 2021-11-25 ENCOUNTER — Other Ambulatory Visit: Payer: Self-pay

## 2021-11-25 ENCOUNTER — Ambulatory Visit (INDEPENDENT_AMBULATORY_CARE_PROVIDER_SITE_OTHER): Payer: BC Managed Care – PPO | Admitting: Ophthalmology

## 2021-11-25 ENCOUNTER — Encounter (INDEPENDENT_AMBULATORY_CARE_PROVIDER_SITE_OTHER): Payer: Self-pay | Admitting: Ophthalmology

## 2021-11-25 DIAGNOSIS — H35353 Cystoid macular degeneration, bilateral: Secondary | ICD-10-CM

## 2021-11-25 DIAGNOSIS — H3552 Pigmentary retinal dystrophy: Secondary | ICD-10-CM

## 2021-11-25 NOTE — Assessment & Plan Note (Signed)
Clinical diagnosis pending electrophysiologic confirmation ? ?Make appointment at [facility] ?

## 2021-11-25 NOTE — Progress Notes (Signed)
? ? ?11/25/2021 ? ?  ? ?CHIEF COMPLAINT ?Patient presents for  ?Chief Complaint  ?Patient presents with  ? Cystoid Macular Edema  ? ? ?Persistent OU with night vision difficulties as well as diffuse retinal atrophy peripherally ? ?HISTORY OF PRESENT ILLNESS: ?Jessica Valdez is a 46 y.o. female who presents to the clinic today for:  ? ?HPI   ?3 weeks Dilate OU OCT. ?Patient states vision is stable and unchanged since last visit. Denies any new floaters or FOL. ? Pt reports she is compliant with taking methazolamide 1 tablet p.o. 3 times daily, and using ketorolac 1 drop OU 4 times daily in both eyes. ?Last edited by Nelva NayKronstein, Anna N on 11/25/2021  8:41 AM.  ?  ? ? ?Referring physician: ?Nathaneil CanaryPayne, Morgan H, PA-C ?[phone] ?[address] ?[place],  [place] [id number] ? ?HISTORICAL INFORMATION:  ? ?Selected notes from the MEDICAL RECORD NUMBER ?  ?   ? ?CURRENT MEDICATIONS: ?Current Outpatient Medications (Ophthalmic Drugs)  ?Medication Sig  ? ketorolac (ACULAR) 0.5 % ophthalmic solution Place 1 drop into both eyes 4 (four) times daily.  ? ?No current facility-administered medications for this visit. (Ophthalmic Drugs)  ? ?Current Outpatient Medications (Other)  ?Medication Sig  ? amLODipine (NORVASC) 5 MG tablet Take 1 tablet (5 mg total) by mouth daily.  ? hydrochlorothiazide (HYDRODIURIL) 25 MG tablet Take 0.5 tablets (12.5 mg total) by mouth daily.  ? methazolamide (NEPTAZANE) 25 MG tablet Take 1 tablet (25 mg total) by mouth 3 (three) times daily.  ? ?No current facility-administered medications for this visit. (Other)  ? ? ? ? ?REVIEW OF SYSTEMS: ? ? ? ?ALLERGIES ?No Known Allergies ? ?PAST MEDICAL HISTORY ?Past Medical History:  ?Diagnosis Date  ? Hypertension   ? ?History reviewed. No pertinent surgical history. ? ?FAMILY HISTORY ?Family History  ?Family history unknown: Yes  ? ? ?SOCIAL HISTORY ?Social History  ? ?Tobacco Use  ? Smoking status: Never  ? Smokeless tobacco: Never  ?Vaping Use  ? Vaping Use: Never used   ?Substance Use Topics  ? Alcohol use: No  ? Drug use: No  ? ?  ? ?  ? ?OPHTHALMIC EXAM: ? ?Base Eye Exam   ? ? Visual Acuity (ETDRS)   ? ?   Right Left  ? Dist [place] 20/60 -2 20/60 -2  ? Dist ph [place] NI NI  ? ?  ?  ? ? Tonometry (Tonopen, 8:45 AM)   ? ?   Right Left  ? Pressure 15 17  ? ?  ?  ? ? Pupils   ? ?   Pupils Dark Light APD  ? Right PERRL 5 4 None  ? Left PERRL 5 4 None  ? ?  ?  ? ? Extraocular Movement   ? ?   Right Left  ?  Full Full  ? ?  ?  ? ? Neuro/Psych   ? ? Oriented x3: Yes  ? Mood/Affect: Normal  ? ?  ?  ? ? Dilation   ? ? Both eyes: 1.0% Mydriacyl, 2.5% Phenylephrine @ 8:45 AM  ? ?  ?  ? ?  ? ?Slit Lamp and Fundus Exam   ? ? External Exam   ? ?   Right Left  ? External Normal Normal  ? ?  ?  ? ? Slit Lamp Exam   ? ?   Right Left  ? Lids/Lashes Normal Normal  ? Conjunctiva/Sclera White and quiet White and quiet  ?  Cornea Clear Clear  ? Anterior Chamber Deep and quiet, no cells Deep and quiet, no cells  ? Iris Round and reactive Round and reactive  ? Lens Trace Nuclear sclerosis, no PSC Trace Nuclear sclerosis, no PSC  ? Anterior Vitreous No cells in the anterior vitreous No cells in the anterior vitreous  ? ?  ?  ? ? Fundus Exam   ? ?   Right Left  ? Posterior Vitreous Normal, no cells, no cells on the pars plana Normal, no cells, no cells on the pars plana  ? Disc Normal, no waxy pallor Normal, no waxy pallor  ? C/D Ratio 0.35 0.35  ? Macula Cystoid macular edema Cystoid macular edema  ? Vessels Essentially normal may have slight attenuation of arteries diffusely Essentially normal may have slight attenuation of arteries diffusely  ? Periphery Normal, no peripheral pigmentation Normal, no peripheral pigmentation  ? ?  ?  ? ?  ? ? ?IMAGING AND PROCEDURES  ?Imaging and Procedures for 11/25/21 ? ?OCT, Retina - OU - Both Eyes   ? ?   ?Right Eye ?Quality was good. Scan locations included subfoveal. Central Foveal Thickness: 357. Progression has no prior data. Findings include abnormal foveal contour,  cystoid macular edema, vitreomacular adhesion .  ? ?Left Eye ?Quality was good. Scan locations included subfoveal. Central Foveal Thickness: 439. Progression has no prior data. Findings include abnormal foveal contour, cystoid macular edema, vitreomacular adhesion .  ? ?Notes ?OD and OS with overall CME centrally yet diffuse macular atrophy also notable peripheral to the central 3 mm of the macula.   ? ?Loss of the EZ layer, photoreceptor layer diffusely outside of the center foveal region.  Accompanying diffuse retinal atrophy ? ? ? ?  ? ? ?  ?  ? ?  ?ASSESSMENT/PLAN: ? ?CME (cystoid macular edema), bilateral ?Persistent center involved CME OU with peripheral retinal degeneration.  No impact of topical use of NSAIDs even combined with methazolamide ? ?We will discontinue methazolamide and not attempt any higher doses since patient already on a diuretic for control of blood pressure ? ?Retinitis pigmentosa, both eyes ?Clinical diagnosis pending electrophysiologic confirmation ? ?Make appointment at [facility]  ? ?  ICD-10-CM   ?1. CME (cystoid macular edema), bilateral  H35.353 OCT, Retina - OU - Both Eyes  ?  ?2. Retinitis pigmentosa, both eyes  H35.52   ?  ? ? ?1.  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 ? ?No response to therapy using low-dose methazolamide tablets or topical ketorolac OU. ? ?2.  No signs of anterior segment inflammation or cyclitis or pars planitis ? ?3.  Will need electrophysiologic evaluation after consultation with [facility],  Retina service. ? ?Ophthalmic Meds Ordered this visit:  ?No orders of the defined types were placed in this encounter. ? ? ?  ? ?Return for Retina consultation possible electrophysiologic testing [facility]. ? ?Patient Instructions  ?Patient to discontinue use of methazolamide tablets ? ?Patient to  discontinue use of topical NSAIDs to each eye, ketorolac ? ? ?Explained the  diagnoses, plan, and follow up with the patient and they expressed understanding.  Patient expressed understanding of the importance of proper follow up care.  ? ?Alford Highland. Dalayah Deahl M.D. ?Diseases & Surgery of the Retina and Vitreous ?[facility] ?11/25/21 ? ? ? ? ?Abbreviations: ?M myopia (nearsighted); A astigmatism; H hyperopia (farsighted); P presbyopia; Mrx spectacle prescription;  CTL contact lenses; OD right eye; OS left eye; OU both eyes  XT exotropia; ET esotropia; PEK punctate epithelial keratitis; PEE punctate epithelial erosions; DES dry eye syndrome; MGD meibomian gland dysfunction; ATs artificial tears; PFAT's preservative free artificial tears; NSC nuclear sclerotic cataract; PSC posterior subcapsular cataract; ERM epi-retinal membrane; PVD posterior vitreous detachment; RD retinal detachment; DM diabetes mellitus; DR diabetic retinopathy; NPDR non-proliferative diabetic retinopathy; PDR proliferative diabetic retinopathy; CSME clinically significant macular edema; DME diabetic macular edema; dbh dot blot hemorrhages; CWS cotton wool spot; POAG primary open angle glaucoma; C/D cup-to-disc ratio; HVF humphrey visual field; GVF goldmann visual field; OCT optical coherence tomography; IOP intraocular pressure; BRVO Branch retinal vein occlusion; CRVO central retinal vein occlusion; CRAO central retinal artery occlusion; BRAO branch retinal artery occlusion; RT retinal tear; SB scleral buckle; PPV pars plana vitrectomy; VH Vitreous hemorrhage; PRP panretinal laser photocoagulation; IVK intravitreal kenalog; VMT vitreomacular traction; MH Macular hole;  NVD neovascularization of the disc; NVE neovascularization elsewhere; AREDS age related eye disease study; ARMD age related macular degeneration; POAG primary open angle glaucoma; EBMD epithelial/anterior basement membrane dystrophy; ACIOL anterior chamber intraocular lens; IOL intraocular lens; PCIOL posterior chamber intraocular lens;  Phaco/IOL phacoemulsification with intraocular lens placement; PRK photorefractive keratectomy; LASIK laser assisted in situ keratomileusis; HTN hypertension; DM diabetes mellitus; COPD chronic obstructive pulm

## 2021-11-25 NOTE — Patient Instructions (Signed)
Patient to discontinue use of methazolamide tablets ? ?Patient to  discontinue use of topical NSAIDs to each eye, ketorolac ?

## 2021-11-25 NOTE — Assessment & Plan Note (Signed)
Persistent center involved CME OU with peripheral retinal degeneration.  No impact of topical use of NSAIDs even combined with methazolamide ? ?We will discontinue methazolamide and not attempt any higher doses since patient already on a diuretic for control of blood pressure ?
